# Patient Record
Sex: Female | Born: 1970 | Race: White | Hispanic: No | Marital: Married | Smoking: Never smoker
Health system: Southern US, Community
[De-identification: ages and names within clinical notes are randomized; demographics above are authoritative.]

## PROBLEM LIST (undated history)

## (undated) DIAGNOSIS — Z87442 Personal history of urinary calculi: Secondary | ICD-10-CM

## (undated) DIAGNOSIS — F329 Major depressive disorder, single episode, unspecified: Secondary | ICD-10-CM

## (undated) DIAGNOSIS — R011 Cardiac murmur, unspecified: Secondary | ICD-10-CM

## (undated) DIAGNOSIS — R51 Headache: Secondary | ICD-10-CM

## (undated) DIAGNOSIS — R519 Headache, unspecified: Secondary | ICD-10-CM

## (undated) DIAGNOSIS — I10 Essential (primary) hypertension: Secondary | ICD-10-CM

## (undated) DIAGNOSIS — K259 Gastric ulcer, unspecified as acute or chronic, without hemorrhage or perforation: Secondary | ICD-10-CM

## (undated) DIAGNOSIS — N189 Chronic kidney disease, unspecified: Secondary | ICD-10-CM

## (undated) DIAGNOSIS — F32A Depression, unspecified: Secondary | ICD-10-CM

## (undated) DIAGNOSIS — K219 Gastro-esophageal reflux disease without esophagitis: Secondary | ICD-10-CM

## (undated) DIAGNOSIS — M1990 Unspecified osteoarthritis, unspecified site: Secondary | ICD-10-CM

## (undated) HISTORY — PX: MOUTH SURGERY: SHX715

---
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
## 2003-12-21 ENCOUNTER — Ambulatory Visit: Payer: Self-pay | Admitting: Obstetrics and Gynecology

## 2004-02-16 ENCOUNTER — Ambulatory Visit: Payer: Self-pay | Admitting: Obstetrics and Gynecology

## 2004-03-17 HISTORY — PX: DILATION AND CURETTAGE OF UTERUS: SHX78

## 2004-07-25 ENCOUNTER — Ambulatory Visit: Payer: Self-pay | Admitting: Obstetrics and Gynecology

## 2005-02-12 ENCOUNTER — Other Ambulatory Visit: Payer: Self-pay

## 2005-02-12 ENCOUNTER — Emergency Department: Payer: Self-pay | Admitting: Emergency Medicine

## 2005-03-05 ENCOUNTER — Ambulatory Visit: Payer: Self-pay | Admitting: Unknown Physician Specialty

## 2006-02-20 ENCOUNTER — Observation Stay: Payer: Self-pay | Admitting: Obstetrics and Gynecology

## 2006-02-25 ENCOUNTER — Inpatient Hospital Stay: Payer: Self-pay | Admitting: Obstetrics and Gynecology

## 2006-10-15 ENCOUNTER — Ambulatory Visit: Payer: Self-pay | Admitting: Obstetrics and Gynecology

## 2006-10-21 ENCOUNTER — Encounter: Admission: RE | Admit: 2006-10-21 | Discharge: 2006-10-21 | Payer: Self-pay | Admitting: Obstetrics and Gynecology

## 2010-08-20 ENCOUNTER — Emergency Department: Payer: Self-pay | Admitting: Internal Medicine

## 2011-03-17 ENCOUNTER — Ambulatory Visit: Payer: Self-pay | Admitting: Obstetrics and Gynecology

## 2011-03-19 ENCOUNTER — Ambulatory Visit: Payer: Self-pay | Admitting: Obstetrics and Gynecology

## 2011-11-14 ENCOUNTER — Other Ambulatory Visit: Payer: Self-pay | Admitting: Obstetrics and Gynecology

## 2011-11-14 LAB — PLATELET COUNT: Platelet: 274 10*3/uL (ref 150–440)

## 2011-12-09 ENCOUNTER — Other Ambulatory Visit: Payer: Self-pay | Admitting: Obstetrics and Gynecology

## 2013-03-03 ENCOUNTER — Ambulatory Visit: Payer: Self-pay | Admitting: Obstetrics and Gynecology

## 2014-05-01 ENCOUNTER — Emergency Department: Payer: Self-pay | Admitting: Student

## 2015-01-26 ENCOUNTER — Emergency Department: Payer: Worker's Compensation

## 2015-01-26 ENCOUNTER — Emergency Department
Admission: EM | Admit: 2015-01-26 | Discharge: 2015-01-26 | Disposition: A | Discharge status: Home / Self Care | Payer: Worker's Compensation | Attending: Emergency Medicine | Admitting: Emergency Medicine

## 2015-01-26 DIAGNOSIS — Y998 Other external cause status: Secondary | ICD-10-CM | POA: Insufficient documentation

## 2015-01-26 DIAGNOSIS — W268XXA Contact with other sharp object(s), not elsewhere classified, initial encounter: Secondary | ICD-10-CM | POA: Diagnosis not present

## 2015-01-26 DIAGNOSIS — Y9289 Other specified places as the place of occurrence of the external cause: Secondary | ICD-10-CM | POA: Diagnosis not present

## 2015-01-26 DIAGNOSIS — Y9389 Activity, other specified: Secondary | ICD-10-CM | POA: Diagnosis not present

## 2015-01-26 DIAGNOSIS — S61311A Laceration without foreign body of left index finger with damage to nail, initial encounter: Secondary | ICD-10-CM | POA: Insufficient documentation

## 2015-01-26 DIAGNOSIS — Z23 Encounter for immunization: Secondary | ICD-10-CM | POA: Insufficient documentation

## 2015-01-26 DIAGNOSIS — S61319A Laceration without foreign body of unspecified finger with damage to nail, initial encounter: Secondary | ICD-10-CM

## 2015-01-26 MED ORDER — LIDOCAINE HCL (PF) 1 % IJ SOLN
10.0000 mL | Freq: Once | INTRAMUSCULAR | Status: AC
  Administered 2015-01-26: 10 mL
  Filled 2015-01-26: qty 10

## 2015-01-26 MED ORDER — HYDROCODONE-ACETAMINOPHEN 5-325 MG PO TABS: 1.0000 | ORAL_TABLET | ORAL | Status: DC | PRN

## 2015-01-26 MED ORDER — HYDROCODONE-ACETAMINOPHEN 5-325 MG PO TABS
1.0000 | ORAL_TABLET | Freq: Once | ORAL | Status: AC
  Administered 2015-01-26: 1 via ORAL

## 2015-01-26 MED ORDER — HYDROCODONE-ACETAMINOPHEN 5-325 MG PO TABS
ORAL_TABLET | ORAL | Status: AC
  Filled 2015-01-26: qty 1

## 2015-01-26 MED ORDER — TETANUS-DIPHTH-ACELL PERTUSSIS 5-2.5-18.5 LF-MCG/0.5 IM SUSP
0.5000 mL | Freq: Once | INTRAMUSCULAR | Status: AC
  Administered 2015-01-26: 0.5 mL via INTRAMUSCULAR
  Filled 2015-01-26: qty 0.5

## 2015-01-26 NOTE — ED Provider Notes (Signed)
[facility] Emergency Department Provider Note  ____________________________________________  Time seen: Approximately 4:09 PM  I have reviewed the triage vital signs and the nursing notes.   HISTORY  Chief Complaint Laceration    HPI Paula Estrada is a 44 y.o. female who presents emergency department complaining of a laceration to her left index finger. She states that she was using a levered paper cutter when she accidentally cut the distal portion of her index finger. She wrapped the wound in a bandage prior to arrival. Bleeding was controlled prior to arrival. Patient's last tetanus was over 5 years ago. The patient does endorse some pain to area. She does endorse minor numbness to the distal aspect of the finger. She does endorse that the laceration went through the nail. She endorses full range of motion to affected digit. Eyes any other injury or complaint.   No past medical history on file.  There are no active problems to display for this patient.   No past surgical history on file.  Current Outpatient Rx  Name  Route  Sig  Dispense  Refill  . HYDROcodone-acetaminophen (NORCO/VICODIN) 5-325 MG tablet   Oral   Take 1 tablet by mouth every 4 (four) hours as needed for moderate pain.   20 tablet   0     Allergies Review of patient's allergies indicates no known allergies.  No family history on file.  Social History Social History  Substance Use Topics  . Smoking status: Not on file  . Smokeless tobacco: Not on file  . Alcohol Use: Not on file    Review of Systems Constitutional: No fever/chills Eyes: No visual changes. ENT: No sore throat. Cardiovascular: Denies chest pain. Respiratory: Denies shortness of breath. Gastrointestinal: No abdominal pain.  No nausea, no vomiting.  No diarrhea.  No constipation. Genitourinary: Negative for dysuria. Musculoskeletal: Negative for back pain. Skin: Negative for rash. Endorses a laceration  to the left index finger traversing through the nailbed. Neurological: Negative for headaches, focal weakness or numbness.  10-point ROS otherwise negative.  ____________________________________________   PHYSICAL EXAM:  VITAL SIGNS: ED Triage Vitals  Enc Vitals Group     BP 01/26/15 1514 165/94 mmHg     Pulse Rate 01/26/15 1514 82     Resp 01/26/15 1514 18     Temp 01/26/15 1514 98.1 F (36.7 C)     Temp Source 01/26/15 1514 Oral     SpO2 01/26/15 1514 98 %     Weight 01/26/15 1514 185 lb (83.915 kg)     Height 01/26/15 1514 5\' 3"  (1.6 m)     Head Cir --      Peak Flow --      Pain Score 01/26/15 1515 3     Pain Loc --      Pain Edu? --      Excl. in [place]? --     Constitutional: Alert and oriented. Well appearing and in no acute distress. Eyes: Conjunctivae are normal. PERRL. EOMI. Head: Atraumatic. Nose: No congestion/rhinnorhea. Mouth/Throat: Mucous membranes are moist.  Oropharynx non-erythematous. Neck: No stridor.   Cardiovascular: Normal rate, regular rhythm. Grossly normal heart sounds.  Good peripheral circulation. Respiratory: Normal respiratory effort.  No retractions. Lungs CTAB. Gastrointestinal: Soft and nontender. No distention. No abdominal bruits. No CVA tenderness. Musculoskeletal: No lower extremity tenderness nor edema.  No joint effusions. Neurologic:  Normal speech and language. No gross focal neurologic deficits are appreciated. No gait instability. Skin:  Skin is warm,  dry. No rash noted. Laceration to the distal aspect of second digit left hand. Laceration traverses from the medial edge of the fingernail through the fingernail including the distal portion of finger. Bleeding is controlled. Sensation is intact. Cap refill is intact. No visible foreign body. Psychiatric: Mood and affect are normal. Speech and behavior are normal.  ____________________________________________   LABS (all labs ordered are listed, but only abnormal results are  displayed)  Labs Reviewed - No data to display ____________________________________________  EKG   ____________________________________________  RADIOLOGY  Left index finger x-ray Impression: No evidence of fracture or dislocation.   I personally reviewed the imaging. ____________________________________________   PROCEDURES  Procedure(s) performed: yes, laceration repair, see procedure note(s).   LACERATION REPAIR Performed by: Darletta Moll Authorized by: Charline Bills Cuthriell Consent: Verbal consent obtained. Risks and benefits: risks, benefits and alternatives were discussed Consent given by: patient Patient identity confirmed: provided demographic data Prepped and Draped in normal sterile fashion Wound explored  Laceration Location: Distal index finger L hand  Laceration Length: 2.5 cm  No Foreign Bodies seen or palpated  Anesthesia: digital block  Local anesthetic: lidocaine 1% without epinephrine  Anesthetic total: 7 ml  Irrigation method: syringe Amount of cleaning: standard  Skin closure: 4-0 Ethilon sutures, skin adhesive   Number of sutures: 2   Technique: Transected nail was removed from nailbed. This was the medial distal aspect. Remaining nailbed is securely intact. Distal aspect of laceration was closed with 2 simple interrupted sutures. Area adjacent to nailbed was closed using skin adhesive. Patient tolerated procedure well with no complications.   Patient tolerance: Patient tolerated the procedure well with no immediate complications.   Critical Care performed: No  ____________________________________________   INITIAL IMPRESSION / ASSESSMENT AND PLAN / ED COURSE  Pertinent labs & imaging results that were available during my care of the patient were reviewed by me and considered in my medical decision making (see chart for details).  Patient's history, symptoms, physical exam are consistent with laceration including nail bed.  Area was thoroughly examined and revealed no foreign body. Patient was in need of a tetanus shot which was applied here in the emergency department. Digital block was used for local anesthesia with good effect. Distal aspect of nail was removed from the nailbed. Laceration was closed using 2 simple interrupted sutures at the distal end of the laceration and skin adhesive for the laceration lying adjacent to remaining nail. Patient was advised to keep area clean and dry and wound was dressed here in the emergency department. She is to follow-up appointment to 10 days for suture removal. Patient verbalizes understanding of diagnosis and treatment plan and verbalizes compliance with same ____________________________________________   FINAL CLINICAL IMPRESSION(S) / ED DIAGNOSES  Final diagnoses:  Laceration of nail bed of finger, initial encounter      Darletta Moll, PA-C 01/27/15 0045  Daymon Larsen, MD 01/28/15 1318

## 2015-01-26 NOTE — ED Notes (Signed)
Pt cut left hand pointer finger with paper cutter. Bleeding controlled.

## 2015-01-26 NOTE — Discharge Instructions (Signed)
Laceration Care, Adult °A laceration is a cut that goes through all of the layers of the skin and into the tissue that is right under the skin. Some lacerations heal on their own. Others need to be closed with stitches (sutures), staples, skin adhesive strips, or skin glue. Proper laceration care minimizes the risk of infection and helps the laceration to heal better. °HOW TO CARE FOR YOUR LACERATION °If sutures or staples were used: °· Keep the wound clean and dry. °· If you were given a bandage (dressing), you should change it at least one time per day or as told by your health care provider. You should also change it if it becomes wet or dirty. °· Keep the wound completely dry for the first 24 hours or as told by your health care provider. After that time, you may shower or bathe. However, make sure that the wound is not soaked in water until after the sutures or staples have been removed. °· Clean the wound one time each day or as told by your health care provider: °· Wash the wound with soap and water. °· Rinse the wound with water to remove all soap. °· Pat the wound dry with a clean towel. Do not rub the wound. °· After cleaning the wound, apply a thin layer of antibiotic ointment as told by your health care provider. This will help to prevent infection and keep the dressing from sticking to the wound. °· Have the sutures or staples removed as told by your health care provider. °If skin adhesive strips were used: °· Keep the wound clean and dry. °· If you were given a bandage (dressing), you should change it at least one time per day or as told by your health care provider. You should also change it if it becomes dirty or wet. °· Do not get the skin adhesive strips wet. You may shower or bathe, but be careful to keep the wound dry. °· If the wound gets wet, pat it dry with a clean towel. Do not rub the wound. °· Skin adhesive strips fall off on their own. You may trim the strips as the wound heals. Do not  remove skin adhesive strips that are still stuck to the wound. They will fall off in time. °If skin glue was used: °· Try to keep the wound dry, but you may briefly wet it in the shower or bath. Do not soak the wound in water, such as by swimming. °· After you have showered or bathed, gently pat the wound dry with a clean towel. Do not rub the wound. °· Do not do any activities that will make you sweat heavily until the skin glue has fallen off on its own. °· Do not apply liquid, cream, or ointment medicine to the wound while the skin glue is in place. Using those may loosen the film before the wound has healed. °· If you were given a bandage (dressing), you should change it at least one time per day or as told by your health care provider. You should also change it if it becomes dirty or wet. °· If a dressing is placed over the wound, be careful not to apply tape directly over the skin glue. Doing that may cause the glue to be pulled off before the wound has healed. °· Do not pick at the glue. The skin glue usually remains in place for 5-10 days, then it falls off of the skin. °General Instructions °· Take over-the-counter and prescription   medicines only as told by your health care provider.  If you were prescribed an antibiotic medicine or ointment, take or apply it as told by your doctor. Do not stop using it even if your condition improves.  To help prevent scarring, make sure to cover your wound with sunscreen whenever you are outside after stitches are removed, after adhesive strips are removed, or when glue remains in place and the wound is healed. Make sure to wear a sunscreen of at least 30 SPF.  Do not scratch or pick at the wound.  Keep all follow-up visits as told by your health care provider. This is important.  Check your wound every day for signs of infection. Watch for:  Redness, swelling, or pain.  Fluid, blood, or pus.  Raise (elevate) the injured area above the level of your heart  while you are sitting or lying down, if possible. SEEK MEDICAL CARE IF:  You received a tetanus shot and you have swelling, severe pain, redness, or bleeding at the injection site.  You have a fever.  A wound that was closed breaks open.  You notice a bad smell coming from your wound or your dressing.  You notice something coming out of the wound, such as wood or glass.  Your pain is not controlled with medicine.  You have increased redness, swelling, or pain at the site of your wound.  You have fluid, blood, or pus coming from your wound.  You notice a change in the color of your skin near your wound.  You need to change the dressing frequently due to fluid, blood, or pus draining from the wound.  You develop a new rash.  You develop numbness around the wound. SEEK IMMEDIATE MEDICAL CARE IF:  You develop severe swelling around the wound.  Your pain suddenly increases and is severe.  You develop painful lumps near the wound or on skin that is anywhere on your body.  You have a red streak going away from your wound.  The wound is on your hand or foot and you cannot properly move a finger or toe.  The wound is on your hand or foot and you notice that your fingers or toes look pale or bluish.   This information is not intended to replace advice given to you by your health care provider. Make sure you discuss any questions you have with your health care provider.   Document Released: 03/03/2005 Document Revised: 07/18/2014 Document Reviewed: 02/27/2014 Elsevier Interactive Patient Education 2016 [place].  Nail Bed Injury The nail bed is the soft tissue under a fingernail or toenail that is the origin for new nail growth. Various types of injuries can occur at the nail bed. These injuries may involve bruising or bleeding under the nail, cuts (lacerations) in the nail or nail bed, or loss of a part of the nail or the whole nail (avulsion). In some cases, a nail bed  injury accompanies another injury, such as a break (fracture) of the bone at the tip of the finger or toe. Nail bed injuries are common in people who have jobs that require performing manual tasks with their hands, such as carpenters and landscapers.  The nail bed includes the growth center of the nail. If this growth center is damaged, the injured nail may not grow back normally if at all. The regrown nail might have an abnormal shape or appearance. It can take several months for a damaged or torn-off nail to regrow. Depending on  the nature and extent of the nail bed injury, there may be a permanent disruption of normal nail growth. CAUSES  Damage to the nail bed area is usually caused by crushing, pinching, cutting, or tearing injuries of the fingertip or toe. For example, these injuries may occur when a fingertip gets caught in a door, hit by a hammer, or damaged in accidents involving electrical tools or power machinery.  SYMPTOMS  Symptoms vary depending on the nature of the injury. Symptoms may include:  Pain in the injured area.  Bleeding.  Swelling.  Discoloration.  Collection of blood under the nail (hematoma).  Deformed or split nail.  Loose nail (not stuck to the nail bed).  Loss of all or part of the nail. DIAGNOSIS  Your caregiver will take a medical history and examine the injured area. You will be asked to describe how the injury occurred. X-rays may be done to see if you have a fracture. Your caregiver might also check for conditions that may affect healing, such as diabetes, nerve problems, or poor circulation.  TREATMENT  Treatment depends on the type of injury.  The injury may not require any special treatment other than keeping the area clean and free of infection.   Your caregiver may drain the collection of blood from under the nail. This can be done by making a small hole in the nail.   Your caregiver may remove all or part of your nail. This might be necessary  to stitch (suture) any laceration in the nail bed. Before doing this, the caregiver will likely give you medication to numb the nail area (local anesthetic). In some cases, the caregiver may choose to numb the entire finger or toe (digital nerve block). Depending on the location and size of the nail bed injury, an avulsed nail is sometimes stitched back in place to provide temporary protection to the nail bed until the new nail grows in.  Your caregiver may apply bandages (dressings) or splints to the area.  You might be prescribed antibiotic medication to help prevent infection.  For certain injuries, your caregiver may direct you to see a hand or foot specialist.  You may need a tetanus shot if:  You cannot remember when you had your last tetanus shot.  You have never had a tetanus shot.  The injury broke your skin. If you get a tetanus shot, your arm may swell, get red, and feel warm to the touch. This is common and not a problem. If you need a tetanus shot and you choose not to have one, there is a rare chance of getting tetanus. Sickness from tetanus can be serious. HOME CARE INSTRUCTIONS   Keep your hand or foot raised (elevated) to relieve pain and swelling.   For an injured toenail, lie in bed or on a couch with your leg on pillows. You can also sit in a recliner with your leg up. Avoid walking or letting your leg dangle. When you walk, wear an open-toe shoe.  For an injured fingernail, keep your hand above the level of your heart. Use pillows on a table or on the arm of your chair while sitting. Use them on your bed while sleeping.   Keep your injury protected with dressings or splints as directed by your caregiver.   Keep any dressings clean and dry. Change or remove your dressings as directed by your caregiver.   Only take over-the-counter or prescription medications as directed by your caregiver. If you were prescribed  antibiotics, take them as directed. Finish them even  if you start to feel better.   Follow up with your caregiver as directed.  SEEK MEDICAL CARE IF:   You have pain that is not controlled with medication.   You have any problems caring for your injury.  SEEK IMMEDIATE MEDICAL CARE IF:   You have increased pain, drainage, or bleeding in the injured area.   You have redness, soreness, and swelling (inflammation) in the injured area.  You have a fever or persistent symptoms for more than 2-3 days.  You have a fever and your symptoms suddenly get worse.  You have swelling that spreads from your finger into your hand or from your toe into your foot.  MAKE SURE YOU:  Understand these instructions.  Will watch your condition.  Will get help right away if you are not doing well or get worse.   This information is not intended to replace advice given to you by your health care provider. Make sure you discuss any questions you have with your health care provider.   Document Released: 04/10/2004 Document Revised: 06/28/2012 Document Reviewed: 03/25/2012 Elsevier Interactive Patient Education 2016 [place].  Nail Bed Injury The nail bed is the soft tissue under a fingernail or toenail that is the origin for new nail growth. Various types of injuries can occur at the nail bed. These injuries may involve bruising or bleeding under the nail, cuts (lacerations) in the nail or nail bed, or loss of a part of the nail or the whole nail (avulsion). In some cases, a nail bed injury accompanies another injury, such as a break (fracture) of the bone at the tip of the finger or toe. Nail bed injuries are common in people who have jobs that require performing manual tasks with their hands, such as carpenters and landscapers.  The nail bed includes the growth center of the nail. If this growth center is damaged, the injured nail may not grow back normally if at all. The regrown nail might have an abnormal shape or appearance. It can take several  months for a damaged or torn-off nail to regrow. Depending on the nature and extent of the nail bed injury, there may be a permanent disruption of normal nail growth. CAUSES  Damage to the nail bed area is usually caused by crushing, pinching, cutting, or tearing injuries of the fingertip or toe. For example, these injuries may occur when a fingertip gets caught in a door, hit by a hammer, or damaged in accidents involving electrical tools or power machinery.  SYMPTOMS  Symptoms vary depending on the nature of the injury. Symptoms may include:  Pain in the injured area.  Bleeding.  Swelling.  Discoloration.  Collection of blood under the nail (hematoma).  Deformed or split nail.  Loose nail (not stuck to the nail bed).  Loss of all or part of the nail. DIAGNOSIS  Your caregiver will take a medical history and examine the injured area. You will be asked to describe how the injury occurred. X-rays may be done to see if you have a fracture. Your caregiver might also check for conditions that may affect healing, such as diabetes, nerve problems, or poor circulation.  TREATMENT  Treatment depends on the type of injury.  The injury may not require any special treatment other than keeping the area clean and free of infection.   Your caregiver may drain the collection of blood from under the nail. This can be done by  making a small hole in the nail.   Your caregiver may remove all or part of your nail. This might be necessary to stitch (suture) any laceration in the nail bed. Before doing this, the caregiver will likely give you medication to numb the nail area (local anesthetic). In some cases, the caregiver may choose to numb the entire finger or toe (digital nerve block). Depending on the location and size of the nail bed injury, an avulsed nail is sometimes stitched back in place to provide temporary protection to the nail bed until the new nail grows in.  Your caregiver may apply  bandages (dressings) or splints to the area.  You might be prescribed antibiotic medication to help prevent infection.  For certain injuries, your caregiver may direct you to see a hand or foot specialist.  You may need a tetanus shot if:  You cannot remember when you had your last tetanus shot.  You have never had a tetanus shot.  The injury broke your skin. If you get a tetanus shot, your arm may swell, get red, and feel warm to the touch. This is common and not a problem. If you need a tetanus shot and you choose not to have one, there is a rare chance of getting tetanus. Sickness from tetanus can be serious. HOME CARE INSTRUCTIONS   Keep your hand or foot raised (elevated) to relieve pain and swelling.   For an injured toenail, lie in bed or on a couch with your leg on pillows. You can also sit in a recliner with your leg up. Avoid walking or letting your leg dangle. When you walk, wear an open-toe shoe.  For an injured fingernail, keep your hand above the level of your heart. Use pillows on a table or on the arm of your chair while sitting. Use them on your bed while sleeping.   Keep your injury protected with dressings or splints as directed by your caregiver.   Keep any dressings clean and dry. Change or remove your dressings as directed by your caregiver.   Only take over-the-counter or prescription medications as directed by your caregiver. If you were prescribed antibiotics, take them as directed. Finish them even if you start to feel better.   Follow up with your caregiver as directed.  SEEK MEDICAL CARE IF:   You have pain that is not controlled with medication.   You have any problems caring for your injury.  SEEK IMMEDIATE MEDICAL CARE IF:   You have increased pain, drainage, or bleeding in the injured area.   You have redness, soreness, and swelling (inflammation) in the injured area.  You have a fever or persistent symptoms for more than 2-3  days.  You have a fever and your symptoms suddenly get worse.  You have swelling that spreads from your finger into your hand or from your toe into your foot.  MAKE SURE YOU:  Understand these instructions.  Will watch your condition.  Will get help right away if you are not doing well or get worse.   This information is not intended to replace advice given to you by your health care provider. Make sure you discuss any questions you have with your health care provider.   Document Released: 04/10/2004 Document Revised: 06/28/2012 Document Reviewed: 03/25/2012 Elsevier Interactive Patient Education [employer].

## 2015-02-06 ENCOUNTER — Encounter: Payer: Self-pay | Admitting: Emergency Medicine

## 2015-02-06 ENCOUNTER — Emergency Department
Admission: EM | Admit: 2015-02-06 | Discharge: 2015-02-06 | Disposition: A | Discharge status: Home / Self Care | Payer: Worker's Compensation | Attending: Emergency Medicine | Admitting: Emergency Medicine

## 2015-02-06 DIAGNOSIS — Z4802 Encounter for removal of sutures: Secondary | ICD-10-CM | POA: Diagnosis present

## 2015-02-06 NOTE — Discharge Instructions (Signed)

## 2015-02-06 NOTE — ED Notes (Signed)
Pt to ED for suture removal from left index finger

## 2015-02-06 NOTE — ED Provider Notes (Signed)
[facility] Emergency Department Provider Note   ____________________________________________  Time seen: 1330  I have reviewed the triage vital signs and the nursing notes.   HISTORY  Chief Complaint Suture / Staple Removal  HPI [name] is a 44 y.o. female who presents to the emergency Department for suture removal.Sutures were inserted here on 01/28/2015. She has had occasional bleeding and the finger remains tender but denies concern for infection.    History reviewed. No pertinent past medical history.  There are no active problems to display for this patient.   History reviewed. No pertinent past surgical history.  Current Outpatient Rx  Name  Route  Sig  Dispense  Refill  . HYDROcodone-acetaminophen (NORCO/VICODIN) 5-325 MG tablet   Oral   Take 1 tablet by mouth every 4 (four) hours as needed for moderate pain.   20 tablet   0     Allergies Review of patient's allergies indicates no known allergies.  No family history on file.  Social History Social History  Substance Use Topics  . Smoking status: Never Smoker   . Smokeless tobacco: None  . Alcohol Use: No    Review of Systems  Constitutional: Denies fever.  HEENT: No change from baseline Respiratory: No cough or shortness of breath Musculoskeletal: No pain. Skin: healing wound; pain gradually resolving.  ____________________________________________   PHYSICAL EXAM:  VITAL SIGNS: ED Triage Vitals  Enc Vitals Group     BP 02/06/15 1242 132/94 mmHg     Pulse Rate 02/06/15 1241 74     Resp 02/06/15 1241 18     Temp 02/06/15 1241 98.4 F (36.9 C)     Temp Source 02/06/15 1241 Oral     SpO2 02/06/15 1241 98 %     Weight 02/06/15 1241 170 lb (77.111 kg)     Height 02/06/15 1241 5\' 3"  (1.6 m)     Head Cir --      Peak Flow --      Pain Score 02/06/15 1242 1     Pain Loc --      Pain Edu? --      Excl. in [place]? --       Constitutional: Appears well. No  distress HEENT: Atraumtaic, normal appearance, EOMI, sclera normal, voice normal. Respiratory: Respirations even and unlabored.  Cardiovascular: Capillary refill normal. Peripheral pulses 2+ Musculoskeletal: Full ROM x 4. Skin: Healing laceration to the left index finger. No evidence of infection or cellulitis. Neurovascular: Gait steady; Alert and oriented x 4.   PROCEDURES  Procedure(s) performed: SUTURE REMOVAL Performed by:   Consent: Verbal consent obtained. Patient identity confirmed: provided demographic data Time out: Immediately prior to procedure a "time out" was called to verify the correct patient, procedure, equipment, support staff and site/side marked as required.  Location details: Left index finger  Wound Appearance: clean  Sutures/Staples Removed: #2 by RN   Facility: sutures placed in this facility Patient tolerance: Patient tolerated the procedure well with no immediate complications.    ____________________________________________   INITIAL IMPRESSION / ASSESSMENT AND PLAN / ED COURSE  Pertinent labs & imaging results that were available during my care of the patient were reviewed by me and considered in my medical decision making (see chart for details).  Wound care discussed. Patient advised to keep covered with sunscreen. Patient was advised to return to the ER for symptoms that change or worsen if unable to schedule an appointment with primary care.  ____________________________________________   FINAL CLINICAL  IMPRESSION(S) / ED DIAGNOSES  Final diagnoses:  Visit for suture removal       Victorino Dike, FNP 02/06/15 1455  Lavonia Drafts, MD 02/06/15 1525

## 2015-06-05 ENCOUNTER — Ambulatory Visit
Admission: RE | Admit: 2015-06-05 | Discharge: 2015-06-05 | Disposition: A | Discharge status: Home / Self Care | Payer: BLUE CROSS/BLUE SHIELD | Source: Ambulatory Visit | Attending: Obstetrics and Gynecology | Admitting: Obstetrics and Gynecology

## 2015-06-05 ENCOUNTER — Other Ambulatory Visit: Payer: Self-pay | Admitting: Obstetrics and Gynecology

## 2015-06-05 DIAGNOSIS — Z1231 Encounter for screening mammogram for malignant neoplasm of breast: Secondary | ICD-10-CM

## 2016-06-05 ENCOUNTER — Other Ambulatory Visit: Payer: Self-pay | Admitting: Obstetrics and Gynecology

## 2016-06-05 DIAGNOSIS — Z1231 Encounter for screening mammogram for malignant neoplasm of breast: Secondary | ICD-10-CM

## 2016-06-09 ENCOUNTER — Ambulatory Visit
Admission: RE | Admit: 2016-06-09 | Discharge: 2016-06-09 | Disposition: A | Discharge status: Home / Self Care | Payer: BLUE CROSS/BLUE SHIELD | Source: Ambulatory Visit | Attending: Obstetrics and Gynecology | Admitting: Obstetrics and Gynecology

## 2016-06-09 DIAGNOSIS — Z1231 Encounter for screening mammogram for malignant neoplasm of breast: Secondary | ICD-10-CM | POA: Diagnosis present

## 2016-06-09 DIAGNOSIS — N6489 Other specified disorders of breast: Secondary | ICD-10-CM | POA: Diagnosis not present

## 2016-10-23 NOTE — H&P (Signed)
Paula Sauger, MD  Obstetrics and Gynecology    [] Hide copied text  Chief Complaint:  scheduled for LSH+ bilateral salpingectomy  Or LAVH / BSO if endometriosis is found  Paula Estrada is a 46 y.o. female here for [place] .pt is here for follow up for menorrhagia with irregular cycles .pt seen by me 05/2016 with irregular cycles . FSH and inhibin normal for premenopause . Bleeding q 2 weeks now  Pt with migraine h/a 20 days per month . No neurology consult to date  Not sleeping well . Does not feel depressed  pt with a long h/o pelvic pain starting one week before onset of cycle . Marland Kitchen No dyspareunia ( currently not sexually active )  No FHX of endometriosis  EMBX negative 08/19/2016 Pap neg U/S today shows a fibroid 3 cm . Complex left ovarian cyst 2.1 cm  SIS : fails to show any pathology at the endometrial cavity    Past Medical History:  has a past medical history of Chickenpox; Depression, unspecified; Kidney stones; Migraines; and Stomach ulcer.  Past Surgical History:  has a past surgical history that includes Upper GI (02/2005) and Dilation and curettage of uterus (02/2004). Family History: family history includes Diabetes in her paternal grandfather; Heart disease in her maternal grandfather, maternal grandmother, paternal grandfather, and paternal grandmother; High blood pressure (Hypertension) in her father and maternal aunt; Ovarian cancer in her maternal aunt. Social History:  reports that she has quit smoking. She has never used smokeless tobacco. She reports that she drinks alcohol. She reports that she does not use drugs. OB/GYN History:          OB History    Gravida Para Term Preterm AB Living   4 3 3     3    SAB TAB Ectopic Molar Multiple Live Births                    Allergies: has No Known Allergies. Medications:  Current Outpatient Prescriptions:  .  ascorbic acid, vitamin C, (VITAMIN C) 100 MG tablet, Take 100 mg by mouth once  daily., Disp: , Rfl:  .  butalbital-acetaminophen-caffeine (FIORICET) 50-325-40 mg tablet, Take 1 tablet by mouth every 4 (four) hours as needed for Pain., Disp: 20 tablet, Rfl: 6 .  citalopram (CELEXA) 20 MG tablet, take 1 tablet by mouth once daily, Disp: 30 tablet, Rfl: 11 .  fluticasone (FLONASE) 50 mcg/actuation nasal spray, Place 2 sprays into both nostrils once daily., Disp: , Rfl:  .  Lactobac no.41/Bifidobact no.7 (PROBIOTIC-10 ORAL), Take by mouth., Disp: , Rfl:  .  loratadine (CLARITIN) 10 mg tablet, Take 10 mg by mouth once daily., Disp: , Rfl:  .  multivitamin tablet, Take 1 tablet by mouth once daily., Disp: , Rfl:  .  nortriptyline (PAMELOR) 10 MG capsule, Take 1 pill at night for one week then increase to 2 pills at night, Disp: 60 capsule, Rfl: 3 .  progesterone (PROMETRIUM) 200 MG capsule, Take 1 capsule (200 mg total) by mouth nightly., Disp: 10 capsule, Rfl: 11  Review of Systems: General:                      No fatigue or weight loss Eyes:                           No vision changes Ears:  No hearing difficulty Respiratory:                No cough or shortness of breath Pulmonary:                  No asthma or shortness of breath Cardiovascular:           No chest pain, palpitations, dyspnea on exertion Gastrointestinal:          No abdominal bloating, chronic diarrhea, constipations, masses, pain or hematochezia Genitourinary:             No hematuria, dysuria, abnormal vaginal discharge, pelvic pain, Menometrorrhagia Lymphatic:                   No swollen lymph nodes Musculoskeletal:         No muscle weakness Neurologic:                  No extremity weakness, syncope, seizure disorder Psychiatric:                  No history of depression, delusions or suicidal/homicidal ideation    Exam:      Vitals:   10/23/16  1439  BP: (!) 130/99  Pulse: 93    Body mass index is 34.37 kg/m.  WDWN white/ female in NAD   Lungs: CTA    CV : RRR without murmur   Breast: exam done in sitting and lying position : No dimpling or retraction, no dominant mass, no spontaneous discharge, no axillary adenopathy Neck:  no thyromegaly Abdomen: soft , no mass, normal active bowel sounds,  non-tender, no rebound tenderness Pelvic: tanner stage 5 ,  External genitalia: vulva /labia no lesions Urethra: no prolapse Vagina: normal physiologic d/c, adequate room for LAVH  Cervix: no lesions, no cervical motion tenderness   Uterus: normal size shape and contour, non-tender Adnexa: no mass,  non-tender   Rectovaginal: Saline infusion sonohysterography: betadine prep to the cervix followed by placement of the HSG catheter into the endometrial canal . Sterile H2O is injected while performing a transvaginal u/s . Findings:no endometrial pathology  Impression:   The primary encounter diagnosis was Menorrhagia with irregular cycle. Diagnoses of Pelvic pain in female and Screening for cervical cancer were also pertinent to this visit.  Possible endometriosis  Plan:  I have spoken with the patient regarding treatment options including expectant management, hormonal options, or surgical intervention. After a full discussion the pt elects to proceed with [facility]  Bilateral salpingectomy . If endometriosis is found than a LAVH with BSO .   pro and cons discussed with the pt of endometrial ablation vs definitive hysterectomy                                    [place]    .  Paula Sauger, MD

## 2016-10-24 ENCOUNTER — Encounter
Admission: RE | Admit: 2016-10-24 | Discharge: 2016-10-24 | Disposition: A | Discharge status: Home / Self Care | Payer: BLUE CROSS/BLUE SHIELD | Source: Ambulatory Visit | Attending: Obstetrics and Gynecology | Admitting: Obstetrics and Gynecology

## 2016-10-24 HISTORY — DX: Gastro-esophageal reflux disease without esophagitis: K21.9

## 2016-10-24 HISTORY — DX: Cardiac murmur, unspecified: R01.1

## 2016-10-24 HISTORY — DX: Headache: R51

## 2016-10-24 HISTORY — DX: Unspecified osteoarthritis, unspecified site: M19.90

## 2016-10-24 HISTORY — DX: Headache, unspecified: R51.9

## 2016-10-24 HISTORY — DX: Personal history of urinary calculi: Z87.442

## 2016-10-24 NOTE — Patient Instructions (Addendum)
  Your procedure is scheduled on: 10-27-16 Report to Same Day Surgery 2nd floor medical mall [facility] Entrance-take elevator on left to 2nd floor.  Check in with surgery information desk.) To find out your arrival time please call [phone] between 1PM - 3PM on 10-24-16  Remember: Instructions that are not followed completely may result in serious medical risk, up to and including death, or upon the discretion of your surgeon and anesthesiologist your surgery may need to be rescheduled.    _x___ 1. Do not eat food or drink liquids after midnight. No gum chewing or hard candies.     __x__ 2. No Alcohol for 24 hours before or after surgery.   __x__3. No Smoking for 24 prior to surgery.   ____  4. Bring all medications with you on the day of surgery if instructed.    __x__ 5. Notify your doctor if there is any change in your medical condition     (cold, fever, infections).     Do not wear jewelry, make-up, hairpins, clips or nail polish.  Do not wear lotions, powders, or perfumes. You may wear deodorant.  Do not shave 48 hours prior to surgery. Men may shave face and neck.  Do not bring valuables to the hospital.    [facility] is not responsible for any belongings or valuables.               Contacts, dentures or bridgework may not be worn into surgery.  Leave your suitcase in the car. After surgery it may be brought to your room.  For patients admitted to the hospital, discharge time is determined by your treatment team.   Patients discharged the day of surgery will not be allowed to drive home.  You will need someone to drive you home and stay with you the night of your procedure.    Please read over the following fact sheets that you were given:     ____ Take anti-hypertensive (unless it includes a diuretic), cardiac, seizure, asthma,     anti-reflux and psychiatric medicines. These include:  1. none  2.  3.  4.  5.  6.  _X___Fleets enema or Magnesium Citrate as  directed-DO FLEETS ENEMA AT HOME 1 HOUR PRIOR TO [place]  ____ Use CHG Soap or sage wipes as directed on instruction sheet   ____ Use inhalers on the day of surgery and bring to hospital day of surgery  ____ Stop Metformin and Janumet 2 days prior to surgery.    ____ Take 1/2 of usual insulin dose the night before surgery and none on the morning surgery.   ____ Follow recommendations from Cardiologist, Pulmonologist or PCP regarding stopping Aspirin, Coumadin, Pllavix ,Eliquis, Effient, or Pradaxa, and Pletal.  X____Stop Anti-inflammatories such as Advil, Aleve, Ibuprofen, Motrin, Naproxen, Naprosyn, Goodies powders or aspirin products. NOW-OK to take Tylenol   _x___ Stop supplements until after surgery-STOP MELATONIN NOW-MAY RESUME AFTER SURGERY   ____ Bring C-Pap to the hospital.

## 2016-10-26 MED ORDER — CEFOXITIN SODIUM-DEXTROSE 2-2.2 GM-% IV SOLR (PREMIX)
2.0000 g | INTRAVENOUS | Status: AC
  Administered 2016-10-27: 2 g via INTRAVENOUS

## 2016-10-27 ENCOUNTER — Ambulatory Visit: Payer: BLUE CROSS/BLUE SHIELD | Admitting: Certified Registered"

## 2016-10-27 ENCOUNTER — Encounter: Payer: Self-pay | Admitting: *Deleted

## 2016-10-27 ENCOUNTER — Encounter
Admission: RE | Disposition: A | Discharge status: Home / Self Care | Payer: Self-pay | Source: Ambulatory Visit | Attending: Obstetrics and Gynecology

## 2016-10-27 ENCOUNTER — Observation Stay
Admission: RE | Admit: 2016-10-27 | Discharge: 2016-10-28 | Disposition: A | Discharge status: Home / Self Care | Payer: BLUE CROSS/BLUE SHIELD | Source: Ambulatory Visit | Attending: Obstetrics and Gynecology | Admitting: Obstetrics and Gynecology

## 2016-10-27 DIAGNOSIS — R102 Pelvic and perineal pain: Secondary | ICD-10-CM | POA: Insufficient documentation

## 2016-10-27 DIAGNOSIS — F329 Major depressive disorder, single episode, unspecified: Secondary | ICD-10-CM | POA: Insufficient documentation

## 2016-10-27 DIAGNOSIS — G8929 Other chronic pain: Secondary | ICD-10-CM | POA: Diagnosis not present

## 2016-10-27 DIAGNOSIS — Z9889 Other specified postprocedural states: Secondary | ICD-10-CM

## 2016-10-27 DIAGNOSIS — Z79899 Other long term (current) drug therapy: Secondary | ICD-10-CM | POA: Insufficient documentation

## 2016-10-27 DIAGNOSIS — N809 Endometriosis, unspecified: Secondary | ICD-10-CM | POA: Insufficient documentation

## 2016-10-27 DIAGNOSIS — N921 Excessive and frequent menstruation with irregular cycle: Secondary | ICD-10-CM | POA: Diagnosis not present

## 2016-10-27 DIAGNOSIS — N8311 Corpus luteum cyst of right ovary: Secondary | ICD-10-CM | POA: Insufficient documentation

## 2016-10-27 DIAGNOSIS — N72 Inflammatory disease of cervix uteri: Secondary | ICD-10-CM | POA: Diagnosis not present

## 2016-10-27 DIAGNOSIS — D251 Intramural leiomyoma of uterus: Secondary | ICD-10-CM | POA: Insufficient documentation

## 2016-10-27 DIAGNOSIS — Z8041 Family history of malignant neoplasm of ovary: Secondary | ICD-10-CM | POA: Insufficient documentation

## 2016-10-27 DIAGNOSIS — Z87891 Personal history of nicotine dependence: Secondary | ICD-10-CM | POA: Insufficient documentation

## 2016-10-27 DIAGNOSIS — Z7951 Long term (current) use of inhaled steroids: Secondary | ICD-10-CM | POA: Insufficient documentation

## 2016-10-27 DIAGNOSIS — N838 Other noninflammatory disorders of ovary, fallopian tube and broad ligament: Secondary | ICD-10-CM | POA: Insufficient documentation

## 2016-10-27 HISTORY — PX: LAPAROSCOPIC SUPRACERVICAL HYSTERECTOMY: SHX5399

## 2016-10-27 HISTORY — PX: LAPAROSCOPIC ASSISTED VAGINAL HYSTERECTOMY: SHX5398

## 2016-10-27 HISTORY — PX: LAPAROSCOPIC BILATERAL SALPINGECTOMY: SHX5889

## 2016-10-27 LAB — POCT PREGNANCY, URINE: PREG TEST UR: NEGATIVE

## 2016-10-27 LAB — BASIC METABOLIC PANEL
Anion gap: 9 (ref 5–15)
BUN: 14 mg/dL (ref 6–20)
CHLORIDE: 105 mmol/L (ref 101–111)
CO2: 25 mmol/L (ref 22–32)
CREATININE: 0.89 mg/dL (ref 0.44–1.00)
Calcium: 8.5 mg/dL — ABNORMAL LOW (ref 8.9–10.3)
GFR calc Af Amer: >60 mL/min (ref >60)
GFR calc non Af Amer: >60 mL/min (ref >60)
GLUCOSE: 112 mg/dL — AB (ref 65–99)
POTASSIUM: 3.4 mmol/L — AB (ref 3.5–5.1)
Sodium: 139 mmol/L (ref 135–145)

## 2016-10-27 LAB — CBC
HEMATOCRIT: 39.4 % (ref 35.0–47.0)
HEMOGLOBIN: 13.5 g/dL (ref 12.0–16.0)
MCH: 29.1 pg (ref 26.0–34.0)
MCHC: 34.3 g/dL (ref 32.0–36.0)
MCV: 84.8 fL (ref 80.0–100.0)
Platelets: 290 10*3/uL (ref 150–440)
RBC: 4.65 MIL/uL (ref 3.80–5.20)
RDW: 12.6 % (ref 11.5–14.5)
WBC: 8.1 10*3/uL (ref 3.6–11.0)

## 2016-10-27 LAB — TYPE AND SCREEN
ABO/RH(D): O POS
Antibody Screen: NEGATIVE

## 2016-10-27 LAB — ABO/RH: ABO/RH(D): O POS

## 2016-10-27 SURGERY — HYSTERECTOMY, SUPRACERVICAL, LAPAROSCOPIC
Anesthesia: General

## 2016-10-27 MED ORDER — PHENYLEPHRINE HCL 10 MG/ML IJ SOLN
INTRAMUSCULAR | Status: AC
  Filled 2016-10-27: qty 1

## 2016-10-27 MED ORDER — LACTATED RINGERS IV SOLN: INTRAVENOUS | Status: DC

## 2016-10-27 MED ORDER — LIDOCAINE-EPINEPHRINE 1 %-1:100000 IJ SOLN
INTRAMUSCULAR | Status: DC | PRN
  Administered 2016-10-27: 9 mL

## 2016-10-27 MED ORDER — ONDANSETRON HCL 4 MG/2ML IJ SOLN: 4.0000 mg | Freq: Once | INTRAMUSCULAR | Status: DC | PRN

## 2016-10-27 MED ORDER — EPHEDRINE SULFATE 50 MG/ML IJ SOLN
INTRAMUSCULAR | Status: AC
  Filled 2016-10-27: qty 1

## 2016-10-27 MED ORDER — LIDOCAINE-EPINEPHRINE 1 %-1:100000 IJ SOLN
INTRAMUSCULAR | Status: AC
  Filled 2016-10-27: qty 1

## 2016-10-27 MED ORDER — ROCURONIUM BROMIDE 100 MG/10ML IV SOLN
INTRAVENOUS | Status: DC | PRN
Start: 2016-10-27 — End: 2016-10-27
  Administered 2016-10-27: 10 mg via INTRAVENOUS
  Administered 2016-10-27: 15 mg via INTRAVENOUS
  Administered 2016-10-27: 40 mg via INTRAVENOUS

## 2016-10-27 MED ORDER — ONDANSETRON HCL 4 MG PO TABS: 4.0000 mg | ORAL_TABLET | Freq: Four times a day (QID) | ORAL | Status: DC | PRN

## 2016-10-27 MED ORDER — SUGAMMADEX SODIUM 200 MG/2ML IV SOLN
INTRAVENOUS | Status: DC | PRN
  Administered 2016-10-27: 200 mg via INTRAVENOUS

## 2016-10-27 MED ORDER — LACTATED RINGERS IV SOLN
INTRAVENOUS | Status: DC
  Administered 2016-10-27: 07:00:00 via INTRAVENOUS

## 2016-10-27 MED ORDER — LACTATED RINGERS IV SOLN
INTRAVENOUS | Status: DC
  Administered 2016-10-27 (×2): via INTRAVENOUS

## 2016-10-27 MED ORDER — CEFOXITIN SODIUM-DEXTROSE 2-2.2 GM-% IV SOLR (PREMIX)
INTRAVENOUS | Status: AC
Start: 2016-10-27 — End: 2016-10-27
  Filled 2016-10-27: qty 50

## 2016-10-27 MED ORDER — ONDANSETRON HCL 4 MG/2ML IJ SOLN
INTRAMUSCULAR | Status: DC | PRN
  Administered 2016-10-27: 4 mg via INTRAVENOUS

## 2016-10-27 MED ORDER — FENTANYL CITRATE (PF) 250 MCG/5ML IJ SOLN
INTRAMUSCULAR | Status: AC
  Filled 2016-10-27: qty 5

## 2016-10-27 MED ORDER — FAMOTIDINE 20 MG PO TABS
ORAL_TABLET | ORAL | Status: AC
Start: 2016-10-27 — End: 2016-10-27
  Filled 2016-10-27: qty 1

## 2016-10-27 MED ORDER — LIDOCAINE HCL (PF) 2 % IJ SOLN
INTRAMUSCULAR | Status: AC
  Filled 2016-10-27: qty 2

## 2016-10-27 MED ORDER — PROPOFOL 10 MG/ML IV BOLUS
INTRAVENOUS | Status: AC
  Filled 2016-10-27: qty 20

## 2016-10-27 MED ORDER — ACETAMINOPHEN 10 MG/ML IV SOLN
INTRAVENOUS | Status: DC | PRN
  Administered 2016-10-27: 1000 mg via INTRAVENOUS

## 2016-10-27 MED ORDER — KETOROLAC TROMETHAMINE 30 MG/ML IJ SOLN
INTRAMUSCULAR | Status: AC
  Filled 2016-10-27: qty 1

## 2016-10-27 MED ORDER — MIDAZOLAM HCL 2 MG/2ML IJ SOLN
INTRAMUSCULAR | Status: DC | PRN
  Administered 2016-10-27: 2 mg via INTRAVENOUS

## 2016-10-27 MED ORDER — BUPIVACAINE HCL 0.5 % IJ SOLN
INTRAMUSCULAR | Status: DC | PRN
  Administered 2016-10-27: 17 mL

## 2016-10-27 MED ORDER — OXYCODONE-ACETAMINOPHEN 5-325 MG PO TABS
1.0000 | ORAL_TABLET | ORAL | Status: DC | PRN
  Administered 2016-10-27 (×3): 2 via ORAL
  Filled 2016-10-27 (×4): qty 2

## 2016-10-27 MED ORDER — LIDOCAINE HCL (CARDIAC) 20 MG/ML IV SOLN
INTRAVENOUS | Status: DC | PRN
  Administered 2016-10-27: 50 mg via INTRAVENOUS

## 2016-10-27 MED ORDER — FENTANYL CITRATE (PF) 100 MCG/2ML IJ SOLN
INTRAMUSCULAR | Status: DC | PRN
  Administered 2016-10-27: 50 ug via INTRAVENOUS
  Administered 2016-10-27: 100 ug via INTRAVENOUS
  Administered 2016-10-27 (×2): 50 ug via INTRAVENOUS

## 2016-10-27 MED ORDER — ACETAMINOPHEN NICU IV SYRINGE 10 MG/ML
INTRAVENOUS | Status: AC
  Filled 2016-10-27: qty 1

## 2016-10-27 MED ORDER — DEXAMETHASONE SODIUM PHOSPHATE 10 MG/ML IJ SOLN
INTRAMUSCULAR | Status: DC | PRN
  Administered 2016-10-27: 10 mg via INTRAVENOUS

## 2016-10-27 MED ORDER — ONDANSETRON HCL 4 MG/2ML IJ SOLN
INTRAMUSCULAR | Status: AC
  Filled 2016-10-27: qty 2

## 2016-10-27 MED ORDER — BUPIVACAINE HCL (PF) 0.5 % IJ SOLN
INTRAMUSCULAR | Status: AC
  Filled 2016-10-27: qty 30

## 2016-10-27 MED ORDER — FAMOTIDINE 20 MG PO TABS
20.0000 mg | ORAL_TABLET | Freq: Once | ORAL | Status: AC
  Administered 2016-10-27: 20 mg via ORAL

## 2016-10-27 MED ORDER — KETOROLAC TROMETHAMINE 30 MG/ML IJ SOLN
INTRAMUSCULAR | Status: DC | PRN
  Administered 2016-10-27: 30 mg via INTRAVENOUS

## 2016-10-27 MED ORDER — CITALOPRAM HYDROBROMIDE 20 MG PO TABS
20.0000 mg | ORAL_TABLET | Freq: Every day | ORAL | Status: DC
  Filled 2016-10-27: qty 1

## 2016-10-27 MED ORDER — ROCURONIUM BROMIDE 50 MG/5ML IV SOLN
INTRAVENOUS | Status: AC
  Filled 2016-10-27: qty 1

## 2016-10-27 MED ORDER — FLEET ENEMA 7-19 GM/118ML RE ENEM: 1.0000 | ENEMA | Freq: Once | RECTAL | Status: DC

## 2016-10-27 MED ORDER — MIDAZOLAM HCL 2 MG/2ML IJ SOLN
INTRAMUSCULAR | Status: AC
  Filled 2016-10-27: qty 2

## 2016-10-27 MED ORDER — SIMETHICONE 80 MG PO CHEW
80.0000 mg | CHEWABLE_TABLET | Freq: Four times a day (QID) | ORAL | Status: DC | PRN
  Administered 2016-10-27 (×2): 80 mg via ORAL
  Filled 2016-10-27 (×2): qty 1

## 2016-10-27 MED ORDER — MORPHINE SULFATE (PF) 4 MG/ML IV SOLN
1.0000 mg | INTRAVENOUS | Status: DC | PRN
  Administered 2016-10-27 (×3): 2 mg via INTRAVENOUS
  Filled 2016-10-27 (×3): qty 1

## 2016-10-27 MED ORDER — ONDANSETRON HCL 4 MG/2ML IJ SOLN
4.0000 mg | Freq: Four times a day (QID) | INTRAMUSCULAR | Status: DC | PRN
Start: 2016-10-27 — End: 2016-10-28

## 2016-10-27 MED ORDER — PROPOFOL 10 MG/ML IV BOLUS
INTRAVENOUS | Status: DC | PRN
  Administered 2016-10-27: 170 mg via INTRAVENOUS

## 2016-10-27 MED ORDER — SUGAMMADEX SODIUM 200 MG/2ML IV SOLN
INTRAVENOUS | Status: AC
  Filled 2016-10-27: qty 2

## 2016-10-27 MED ORDER — FENTANYL CITRATE (PF) 100 MCG/2ML IJ SOLN: 25.0000 ug | INTRAMUSCULAR | Status: DC | PRN

## 2016-10-27 MED ORDER — DEXAMETHASONE SODIUM PHOSPHATE 10 MG/ML IJ SOLN
INTRAMUSCULAR | Status: AC
  Filled 2016-10-27: qty 1

## 2016-10-27 SURGICAL SUPPLY — 59 items
BAG URO DRAIN 2000ML W/SPOUT (MISCELLANEOUS) ×4 IMPLANT
BLADE SURG SZ11 CARB STEEL (BLADE) ×8 IMPLANT
CANISTER SUCT 1200ML W/VALVE (MISCELLANEOUS) ×4 IMPLANT
CATH FOLEY 2WAY  5CC 16FR (CATHETERS) ×2
CATH URTH 16FR FL 2W BLN LF (CATHETERS) ×2 IMPLANT
CHLORAPREP W/TINT 26ML (MISCELLANEOUS) ×4 IMPLANT
CLOSURE WOUND 1/2 X4 (GAUZE/BANDAGES/DRESSINGS) ×1
DERMABOND ADVANCED (GAUZE/BANDAGES/DRESSINGS) ×2
DERMABOND ADVANCED .7 DNX12 (GAUZE/BANDAGES/DRESSINGS) ×2 IMPLANT
DRAPE SURG 17X11 SM STRL (DRAPES) IMPLANT
DRSG TEGADERM 2-3/8X2-3/4 SM (GAUZE/BANDAGES/DRESSINGS) ×12 IMPLANT
DRSG TEGADERM 2X2.25 PEDS (GAUZE/BANDAGES/DRESSINGS) ×4 IMPLANT
ELECT REM PT RETURN 9FT ADLT (ELECTROSURGICAL) ×4
ELECTRODE REM PT RTRN 9FT ADLT (ELECTROSURGICAL) ×2 IMPLANT
FILTER LAP SMOKE EVAC STRL (MISCELLANEOUS) ×4 IMPLANT
GAUZE SPONGE NON-WVN 2X2 STRL (MISCELLANEOUS) ×4 IMPLANT
GLOVE BIO SURGEON STRL SZ8 (GLOVE) ×16 IMPLANT
GOWN STRL REUS W/ TWL LRG LVL3 (GOWN DISPOSABLE) ×6 IMPLANT
GOWN STRL REUS W/ TWL XL LVL3 (GOWN DISPOSABLE) ×6 IMPLANT
GOWN STRL REUS W/TWL LRG LVL3 (GOWN DISPOSABLE) ×6
GOWN STRL REUS W/TWL XL LVL3 (GOWN DISPOSABLE) ×6
GRASPER SUT TROCAR 14GX15 (MISCELLANEOUS) ×4 IMPLANT
HANDLE YANKAUER SUCT BULB TIP (MISCELLANEOUS) ×4 IMPLANT
IRRIGATION STRYKERFLOW (MISCELLANEOUS) ×2 IMPLANT
IRRIGATOR STRYKERFLOW (MISCELLANEOUS) ×4
IV LACTATED RINGERS 1000ML (IV SOLUTION) ×4 IMPLANT
KIT PINK PAD W/HEAD ARE REST (MISCELLANEOUS) ×4
KIT PINK PAD W/HEAD ARM REST (MISCELLANEOUS) ×2 IMPLANT
KIT RM TURNOVER CYSTO AR (KITS) ×4 IMPLANT
LABEL OR SOLS (LABEL) ×4 IMPLANT
MORCELLATOR XCISE  COR (MISCELLANEOUS)
MORCELLATOR XCISE COR (MISCELLANEOUS) IMPLANT
NS IRRIG 500ML POUR BTL (IV SOLUTION) ×4 IMPLANT
PACK BASIN MINOR ARMC (MISCELLANEOUS) ×4 IMPLANT
PACK GYN LAPAROSCOPIC (MISCELLANEOUS) ×4 IMPLANT
PAD OB MATERNITY 4.3X12.25 (PERSONAL CARE ITEMS) ×4 IMPLANT
PAD PREP 24X41 OB/GYN DISP (PERSONAL CARE ITEMS) ×4 IMPLANT
SCISSORS METZENBAUM CVD 33 (INSTRUMENTS) IMPLANT
SET CYSTO W/LG BORE CLAMP LF (SET/KITS/TRAYS/PACK) ×4 IMPLANT
SHEARS HARMONIC ACE PLUS 36CM (ENDOMECHANICALS) ×4 IMPLANT
SLEEVE ENDOPATH XCEL 5M (ENDOMECHANICALS) IMPLANT
SOLUTION ELECTROLUBE (MISCELLANEOUS) ×4 IMPLANT
SPONGE VERSALON 2X2 STRL (MISCELLANEOUS) ×4
SPONGE XRAY 4X4 16PLY STRL (MISCELLANEOUS) ×4 IMPLANT
STRIP CLOSURE SKIN 1/2X4 (GAUZE/BANDAGES/DRESSINGS) ×3 IMPLANT
SUT VIC AB 0 CT1 27 (SUTURE) ×4
SUT VIC AB 0 CT1 27XCR 8 STRN (SUTURE) ×4 IMPLANT
SUT VIC AB 0 CT1 36 (SUTURE) ×8 IMPLANT
SUT VIC AB 0 CT2 27 (SUTURE) IMPLANT
SUT VIC AB 2-0 UR6 27 (SUTURE) IMPLANT
SUT VIC AB 4-0 SH 27 (SUTURE)
SUT VIC AB 4-0 SH 27XANBCTRL (SUTURE) IMPLANT
SYR CONTROL 10ML (SYRINGE) ×4 IMPLANT
SYRINGE 10CC LL (SYRINGE) ×4 IMPLANT
TROCAR ENDO BLADELESS 11MM (ENDOMECHANICALS) ×4 IMPLANT
TROCAR XCEL NON-BLD 5MMX100MML (ENDOMECHANICALS) ×4 IMPLANT
TROCAR XCEL UNIV SLVE 11M 100M (ENDOMECHANICALS) ×4 IMPLANT
TUBING INSUF HEATED (TUBING) ×4 IMPLANT
TUBING INSUFFLATOR HI FLOW (MISCELLANEOUS) ×4 IMPLANT

## 2016-10-27 NOTE — Progress Notes (Signed)
Lab back in to recollect for abo rh.

## 2016-10-27 NOTE — Progress Notes (Signed)
Patient ID: Paula Estrada, female   DOB: Dec 21, 1970, 47 y.o.   MRN: [id number] DOS no c/o . Pain in good control  Reviewed surgery with pt .  VSS  Good urine output  Anticipate d/s in am

## 2016-10-27 NOTE — Anesthesia Procedure Notes (Signed)
Procedure Name: Intubation Performed by: Shamyra Farias Pre-anesthesia Checklist: Patient identified, Patient being monitored, Timeout performed, Emergency Drugs available and Suction available Patient Re-evaluated:Patient Re-evaluated prior to induction Oxygen Delivery Method: Circle system utilized Preoxygenation: Pre-oxygenation with 100% oxygen Induction Type: IV induction Ventilation: Mask ventilation without difficulty Laryngoscope Size: Miller and 2 Grade View: Grade I Tube type: Oral Tube size: 7.0 mm Number of attempts: 1 Airway Equipment and Method: Stylet Placement Confirmation: ETT inserted through vocal cords under direct vision,  positive ETCO2 and breath sounds checked- equal and bilateral Secured at: 21 cm Tube secured with: Tape Dental Injury: Teeth and Oropharynx as per pre-operative assessment        

## 2016-10-27 NOTE — Anesthesia Post-op Follow-up Note (Signed)
Anesthesia QCDR form completed.        

## 2016-10-27 NOTE — Anesthesia Postprocedure Evaluation (Signed)
Anesthesia Post Note  Patient: Paula Estrada  Procedure(s) Performed: Procedure(s) (LRB): LAPAROSCOPIC SUPRACERVICAL HYSTERECTOMY (N/A) LAPAROSCOPIC BILATERAL SALPINGECTOMY (Bilateral) LAPAROSCOPIC ASSISTED VAGINAL HYSTERECTOMY (N/A)  Patient location during evaluation: PACU Anesthesia Type: General Level of consciousness: awake and alert and oriented Pain management: pain level controlled Vital Signs Assessment: post-procedure vital signs reviewed and stable Respiratory status: spontaneous breathing Cardiovascular status: blood pressure returned to baseline Anesthetic complications: no     Last Vitals:  Vitals:   10/27/16 1243 10/27/16 1414  BP: 114/73 138/83  Pulse: 88 97  Resp: 18 16  Temp: 36.8 C 36.6 C  SpO2: 97% 98%    Last Pain:  Vitals:   10/27/16 1454  TempSrc:   PainSc: 3                  [name]

## 2016-10-27 NOTE — Brief Op Note (Signed)
10/27/2016  9:39 AM  PATIENT:  Paula Estrada  46 y.o. female  PRE-OPERATIVE DIAGNOSIS:  Menorrhagia, chronic pelvic pain   POST-OPERATIVE DIAGNOSIS:  Menorrhagia, endometriosis  PROCEDURE:  LAVH , BSO  SURGEON:  Surgeon(s) and Role:    * Kiran Carline, Gwen Her, MD - Primary    * Benjaman Kindler, MD  PHYSICIAN ASSISTANT: beasley   ASSISTANTS:  Verdell Carmine , PA student  ANESTHESIA:   general  EBL:  Total I/O In: 800 [I.V.:800] Out: 675 [Urine:600; Blood:75]  BLOOD ADMINISTERED:none  DRAINS: Urinary Catheter (Foley)   LOCAL MEDICATIONS USED:  MARCAINE    and LIDOCAINE   SPECIMEN:  Source of Specimen:  cervix , uterus , bilateral fallopian tubes and ovaries   DISPOSITION OF SPECIMEN:  PATHOLOGY  COUNTS:  YES  TOURNIQUET:  * No tourniquets in log *  DICTATION: .Other Dictation: Dictation Number verbal  PLAN OF CARE: Admit for overnight observation  PATIENT DISPOSITION:  PACU - hemodynamically stable.   Delay start of Pharmacological VTE agent (>24hrs) due to surgical blood loss or risk of bleeding: not applicable

## 2016-10-27 NOTE — Progress Notes (Signed)
Pt ready for [facility] and bilateral salpingectomy , possible right oophorectomy , possible BSO and LAVH if endometriosis is noted   NPO   labs reviewed . All questions answered

## 2016-10-27 NOTE — Transfer of Care (Signed)
Immediate Anesthesia Transfer of Care Note  Patient: Paula Estrada  Procedure(s) Performed: Procedure(s): LAPAROSCOPIC SUPRACERVICAL HYSTERECTOMY (N/A) LAPAROSCOPIC BILATERAL SALPINGECTOMY (Bilateral) LAPAROSCOPIC ASSISTED VAGINAL HYSTERECTOMY (N/A)  Patient Location: PACU  Anesthesia Type:General  Level of Consciousness: sedated  Airway & Oxygen Therapy: Patient Spontanous Breathing and Patient connected to face mask oxygen  Post-op Assessment: Report given to RN and Post -op Vital signs reviewed and stable  Post vital signs: Reviewed  Last Vitals:  Vitals:   10/27/16 0616 10/27/16 0952  BP: 136/89 124/80  Pulse: 95   Resp: 16 18  Temp: 36.7 C 36.5 C  SpO2: 96% 99%    Last Pain:  Vitals:   10/27/16 0616  TempSrc: Oral         Complications: No apparent anesthesia complications

## 2016-10-27 NOTE — Anesthesia Preprocedure Evaluation (Signed)
Anesthesia Evaluation  Patient identified by MRN, date of birth, ID band Patient awake    Reviewed: Allergy & Precautions, NPO status , Patient's Chart, lab work & pertinent test results  Airway Mallampati: II  TM Distance: >3 FB     Dental   Pulmonary neg pulmonary ROS,    Pulmonary exam normal        Cardiovascular negative cardio ROS Normal cardiovascular exam+ Valvular Problems/Murmurs      Neuro/Psych  Headaches, negative psych ROS   GI/Hepatic Neg liver ROS, GERD  ,  Endo/Other  negative endocrine ROS  Renal/GU negative Renal ROS  negative genitourinary   Musculoskeletal  (+) Arthritis , Osteoarthritis,    Abdominal Normal abdominal exam  (+)   Peds negative pediatric ROS (+)  Hematology negative hematology ROS (+)   Anesthesia Other Findings   Reproductive/Obstetrics                             Anesthesia Physical Anesthesia Plan  ASA: II  Anesthesia Plan: General   Post-op Pain Management:    Induction: Intravenous  PONV Risk Score and Plan:   Airway Management Planned: Oral ETT  Additional Equipment:   Intra-op Plan:   Post-operative Plan: Extubation in OR  Informed Consent: I have reviewed the patients History and Physical, chart, labs and discussed the procedure including the risks, benefits and alternatives for the proposed anesthesia with the patient or authorized representative who has indicated his/her understanding and acceptance.   Dental advisory given  Plan Discussed with: CRNA and Surgeon  Anesthesia Plan Comments:         Anesthesia Quick Evaluation

## 2016-10-28 DIAGNOSIS — N921 Excessive and frequent menstruation with irregular cycle: Secondary | ICD-10-CM | POA: Diagnosis not present

## 2016-10-28 LAB — BASIC METABOLIC PANEL
ANION GAP: 7 (ref 5–15)
BUN: 10 mg/dL (ref 6–20)
CALCIUM: 8.4 mg/dL — AB (ref 8.9–10.3)
CO2: 26 mmol/L (ref 22–32)
CREATININE: 0.78 mg/dL (ref 0.44–1.00)
Chloride: 107 mmol/L (ref 101–111)
GLUCOSE: 113 mg/dL — AB (ref 65–99)
Potassium: 3.8 mmol/L (ref 3.5–5.1)
Sodium: 140 mmol/L (ref 135–145)

## 2016-10-28 LAB — CBC
HCT: 37.8 % (ref 35.0–47.0)
Hemoglobin: 12.8 g/dL (ref 12.0–16.0)
MCH: 28.8 pg (ref 26.0–34.0)
MCHC: 33.9 g/dL (ref 32.0–36.0)
MCV: 85.1 fL (ref 80.0–100.0)
PLATELETS: 306 10*3/uL (ref 150–440)
RBC: 4.44 MIL/uL (ref 3.80–5.20)
RDW: 12.9 % (ref 11.5–14.5)
WBC: 18.1 10*3/uL — ABNORMAL HIGH (ref 3.6–11.0)

## 2016-10-28 MED ORDER — SIMETHICONE 80 MG PO CHEW: 80.0000 mg | CHEWABLE_TABLET | Freq: Four times a day (QID) | ORAL | 0 refills | Status: AC | PRN

## 2016-10-28 MED ORDER — OXYCODONE-ACETAMINOPHEN 5-325 MG PO TABS: 1.0000 | ORAL_TABLET | ORAL | 0 refills | Status: AC | PRN

## 2016-10-28 MED ORDER — ONDANSETRON HCL 4 MG PO TABS: 4.0000 mg | ORAL_TABLET | Freq: Four times a day (QID) | ORAL | 0 refills | Status: AC | PRN

## 2016-10-28 MED ORDER — IBUPROFEN 800 MG PO TABS: 800.0000 mg | ORAL_TABLET | Freq: Three times a day (TID) | ORAL | 0 refills | Status: AC | PRN

## 2016-10-28 MED ORDER — DOCUSATE SODIUM 100 MG PO CAPS: 100.0000 mg | ORAL_CAPSULE | Freq: Every day | ORAL | 2 refills | Status: AC | PRN

## 2016-10-28 NOTE — Op Note (Signed)
Paula Estrada, Paula Estrada NO.:  [id number]  MEDICAL RECORD NO.:  [id number]  LOCATION:                                 FACILITY:  PHYSICIAN:  Laverta Baltimore, MD     DATE OF BIRTH:  DATE OF PROCEDURE:  10/27/2016 DATE OF DISCHARGE:                              OPERATIVE REPORT   PREOPERATIVE DIAGNOSIS: 1. Menorrhagia. 2. Chronic pelvic pain.  POSTOPERATIVE DIAGNOSIS: 1. Menorrhagia. 2. Chronic pelvic pain. 3. Endometriosis.  PROCEDURE PERFORMED:  Laparoscopic-assisted vaginal hysterectomy with bilateral salpingo-oophorectomy.  SURGEON:  Laverta Baltimore, MD  ANESTHESIA:  General endotracheal anesthesia.  SURGEON:  Laverta Baltimore, MD.  FIRST ASSISTANT:  Leafy Ro.  SECOND ASSISTANT:  PA student, Eston Esters.  INDICATION:  [name] with worsening menorrhagia with a negative workup.  The patient has a longstanding history of pelvic pain preceding her menstrual cycle consistent with endometriosis.  FINDINGS:  Ovarian mass consistent with redundant fallopian tube attached to the distal portion of the right ovary.  Powder burn areas of the mesosalpinx and of the ovarian stroma noted.  DESCRIPTION OF PROCEDURE:  After adequate general endotracheal anesthesia, the patient was placed in dorsal supine position.  The patient was prepped and draped in normal sterile fashion.  She did receive 2 g IV cefoxitin prior to commencement of the case.  A time-out was performed.  A 12 mm infraumbilical incision was made after injecting with 0.5% Marcaine.  The laparoscope was advanced into the abdominal cavity under direct visualization with the Optiview cannula.  Second port was placed left lower quadrant, 3 cm medial to the left anterior iliac spine and the trocar was advanced under direct visualization. Third port site was placed right lower quadrant again 3 cm medial to the right anterior iliac spine, 5 mm port was advanced under  direct visualization.  The patient was placed in Trendelenburg.  Initial impression revealed the findings as above with areas consistent with the right ovary and right mesosalpinx consistent with endometriosis.  A looped fleshy mass approximately 2 x 2 cm was noted to be attached to the distal portion of the right ovary.  The left infundibulopelvic ligament was cauterized and transected with Harmonic scalpel and similarly the right infundibulopelvic ligament was cauterized and transected, and the mesosalpinx were bilaterally transected to the uterus.  The round ligaments were bilaterally transected and the anterior peritoneum was opened to create a bladder flap.  Uterine arteries were bilaterally skeletonized, cauterized, and transected with harmonic scalpel.  Good hemostasis was noted.  The procedure then continued vaginally where the anterior and posterior cervix were grasped with thyroid tenacula.  Cervix was circumferentially injected with 1% lidocaine with epinephrine.  A posterior colpotomy incision was made upon entry into the posterior cul-de-sac.  Uterosacral ligaments were bilaterally clamped, transected, suture ligated with 0 Vicryl suture and tagged for later identification.  Anterior cervix was circumferentially incised with the Bovie.  The anterior cul-de-sac was entered without difficulty and the cardinal ligaments were then bilaterally clamped, transected, suture ligated with 0 Vicryl.  One additional bite in the broad ligament was performed bilaterally and the uterus, cervix, fallopian tubes, and ovaries were then delivered.  Good  hemostasis was noted.  The vaginal cuff was then closed with a running 0 Vicryl suture. Good approximation of edges.  Good hemostasis was noted.  The patient's abdomen was then re-insufflated with carbon dioxide.  Good hemostasis was noted.  Pressure was lowered to 7 mmHg and there was no additional bleeding.  Upper abdomen appeared normal.  The  infraumbilical and left lower port sites were closed with 2 layers, fascial layer with 2-0 Vicryl suture and all 3 incisions were closed with interrupted 4-0 Vicryl suture.  Sterile dressings were applied.  Of note, there was normal peristaltic activity of bilateral ureters prior to the first incision, and again post hysterectomy, there was normal ureteral peristaltic activity bilaterally.  The patient tolerated the procedure well.  ESTIMATED BLOOD LOSS:  75 mL.  INTRAOPERATIVE FLUIDS:  800 mL.  URINE OUTPUT:  600 mL.  The patient was taken to recovery room in good condition.    ______________________________ Laverta Baltimore, MD   ______________________________ Laverta Baltimore, MD    TS/MEDQ  D:  10/27/2016  T:  10/27/2016  Job:  [id number]

## 2016-10-28 NOTE — Progress Notes (Signed)
Discharged via wheelchair escorted by mother and auxilary.

## 2016-10-28 NOTE — Discharge Summary (Signed)
Physician Discharge Summary  Patient ID: Paula Estrada MRN: [id number] DOB/AGE: Mar 26, 1970 46 y.o.  Admit date: 10/27/2016 Discharge date: 10/28/2016  Admission [facility] , [facility]  Discharge Diagnoses:  Active Problems:   Post-operative state   Discharged Condition: good  Hospital Course: underwent an uncomplicated LAVH / BSO   Consults: None  Significant Diagnostic Studies: labs:  Results for orders placed or performed during the hospital encounter of 10/27/16 (from the past 24 hour(s))  CBC     Status: Abnormal   Collection Time: 10/28/16  4:49 AM  Result Value Ref Range   WBC 18.1 (H) 3.6 - 11.0 K/uL   RBC 4.44 3.80 - 5.20 MIL/uL   Hemoglobin 12.8 12.0 - 16.0 g/dL   HCT 37.8 35.0 - 47.0 %   MCV 85.1 80.0 - 100.0 fL   MCH 28.8 26.0 - 34.0 pg   MCHC 33.9 32.0 - 36.0 g/dL   RDW 12.9 11.5 - 14.5 %   Platelets 306 150 - 440 K/uL  Basic metabolic panel     Status: Abnormal   Collection Time: 10/28/16  4:49 AM  Result Value Ref Range   Sodium 140 135 - 145 mmol/L   Potassium 3.8 3.5 - 5.1 mmol/L   Chloride 107 101 - 111 mmol/L   CO2 26 22 - 32 mmol/L   Glucose, Bld 113 (H) 65 - 99 mg/dL   BUN 10 6 - 20 mg/dL   Creatinine, Ser 0.78 0.44 - 1.00 mg/dL   Calcium 8.4 (L) 8.9 - 10.3 mg/dL   GFR calc non Af Amer >60 >60 mL/min   GFR calc Af Amer >60 >60 mL/min   Anion gap 7 5 - 15    Treatments: surgery: as above  Discharge Exam: Blood pressure (!) 145/92, pulse 88, temperature 98.5 F (36.9 C), temperature source Oral, resp. rate 18, height 5\' 3"  (1.6 m), weight 90 kg (198 lb 8 oz), last menstrual period 10/15/2016, SpO2 98 %. General appearance: alert Head: Normocephalic, without obvious abnormality, atraumatic Lungs CTA  CV RRR  Incision C/D/I  Abd: soft non distended  Disposition: 01-Home or Self Care  Discharge Instructions    Call MD for:    Complete by:  As directed    Heavy vaginal bleeding   Call MD for:  difficulty breathing, headache or  visual disturbances    Complete by:  As directed    Call MD for:  extreme fatigue    Complete by:  As directed    Call MD for:  hives    Complete by:  As directed    Call MD for:  persistant dizziness or light-headedness    Complete by:  As directed    Call MD for:  persistant nausea and vomiting    Complete by:  As directed    Call MD for:  redness, tenderness, or signs of infection (pain, swelling, redness, odor or green/yellow discharge around incision site)    Complete by:  As directed    Call MD for:  severe uncontrolled pain    Complete by:  As directed    Call MD for:  temperature >100.4    Complete by:  As directed    Diet - low sodium heart healthy    Complete by:  As directed    Increase activity slowly    Complete by:  As directed      Allergies as of 10/28/2016   No Known Allergies     Medication List    STOP  taking these medications   progesterone 200 MG capsule Commonly known as:  PROMETRIUM     TAKE these medications   calcium carbonate 500 MG chewable tablet Commonly known as:  TUMS - dosed in mg elemental calcium Chew 1 tablet by mouth as needed for indigestion or heartburn.   citalopram 20 MG tablet Commonly known as:  CELEXA Take 20 mg by mouth at bedtime.   docusate sodium 100 MG capsule Commonly known as:  COLACE Take 1 capsule (100 mg total) by mouth daily as needed.   fluticasone 50 MCG/ACT nasal spray Commonly known as:  FLONASE Place 1 spray into both nostrils daily.   ibuprofen 800 MG tablet Commonly known as:  ADVIL,MOTRIN Take 1 tablet (800 mg total) by mouth every 8 (eight) hours as needed.   loratadine 10 MG tablet Commonly known as:  CLARITIN Take 10 mg by mouth daily.   Melatonin 5 MG Tabs Take 1 tablet by mouth at bedtime.   multivitamin with minerals tablet Take 1 tablet by mouth daily.   nortriptyline 10 MG capsule Commonly known as:  PAMELOR Take 20 mg by mouth at bedtime.   ondansetron 4 MG tablet Commonly known  as:  ZOFRAN Take 1 tablet (4 mg total) by mouth every 6 (six) hours as needed for nausea.   oxyCODONE-acetaminophen 5-325 MG tablet Commonly known as:  PERCOCET/ROXICET Take 1-2 tablets by mouth every 4 (four) hours as needed (moderate to severe pain (when tolerating fluids)).   PROBIOTIC DAILY PO Take 1 capsule by mouth daily.   simethicone 80 MG chewable tablet Commonly known as:  MYLICON Chew 1 tablet (80 mg total) by mouth 4 (four) times daily as needed for flatulence.   vitamin C 500 MG tablet Commonly known as:  ASCORBIC ACID Take 500 mg by mouth daily.      Follow-up Information    Schermerhorn, Gwen Her, MD Follow up in 2 week(s).   Specialty:  Obstetrics and Gynecology Why:  post op Contact information: [address] [place] [address] [phone]           Signed: Gwen Her Schermerhorn 10/28/2016, 9:12 AM

## 2016-10-28 NOTE — Progress Notes (Signed)
10/28/2016 9:52 AM  BP (!) 145/92 Comment: nurse Orie Rout notified  Pulse 88   Temp 98.5 F (36.9 C) (Oral)   Resp 18   Ht 5\' 3"  (160 cm)   Wt 198 lb 8 oz (90039 g)   LMP 10/15/2016 (Exact Date)   SpO2 98%   BMI 35.16 kg/m  Patient discharged per MD orders. Discharge instructions reviewed with patient and patient verbalized understanding. IV removed per policy. Prescriptions discussed and given to patient. Patient getting dressed and gathering belongings. To call RN when ready to be discharged.  Almedia Balls, RN

## 2016-10-29 LAB — SURGICAL PATHOLOGY

## 2017-06-10 ENCOUNTER — Other Ambulatory Visit: Payer: Self-pay | Admitting: Obstetrics and Gynecology

## 2017-06-10 DIAGNOSIS — Z1231 Encounter for screening mammogram for malignant neoplasm of breast: Secondary | ICD-10-CM

## 2017-06-22 ENCOUNTER — Encounter: Payer: Self-pay | Admitting: Radiology

## 2017-06-22 ENCOUNTER — Ambulatory Visit
Admission: RE | Admit: 2017-06-22 | Discharge: 2017-06-22 | Disposition: A | Discharge status: Home / Self Care | Payer: BLUE CROSS/BLUE SHIELD | Source: Ambulatory Visit | Attending: Obstetrics and Gynecology | Admitting: Obstetrics and Gynecology

## 2017-06-22 DIAGNOSIS — Z1231 Encounter for screening mammogram for malignant neoplasm of breast: Secondary | ICD-10-CM | POA: Insufficient documentation

## 2017-08-21 ENCOUNTER — Encounter: Payer: BLUE CROSS/BLUE SHIELD | Attending: Obstetrics and Gynecology | Admitting: Dietician

## 2017-08-21 ENCOUNTER — Encounter: Payer: Self-pay | Admitting: Dietician

## 2017-08-21 VITALS — Ht 63.0 in | Wt 177.3 lb

## 2017-08-21 DIAGNOSIS — R7302 Impaired glucose tolerance (oral): Secondary | ICD-10-CM

## 2017-08-21 NOTE — Patient Instructions (Addendum)
   Low sugar on the nutrition facts label: 10g or less per serving. In the ingredient list, if there are ingredients ending in "ose" this is likely added sugar  Have several options for healthy snacks on hand for when on-the-go. Ideally, that contain protein / healthy fats and/or fiber. These will help you feel more full and satisfied. Try not to go more than 4 hours without something to eat to avoid over-eating and cravings. Healthy fats include chia seeds, flax seeds, avocado, eggs, olives and olive oil, tuna, trout, or salmon  Have foods you enjoy in moderation (20%), but make about 80% of your food choices nutrient-dense, whole food based options  Use the plate method or the hand portion guide to help plan meals. Eat foods that make you feel satisfied at meal times to prevent cravings later in the day. You may find that larger portions at a meal or snack earlier in the day help to prevent over-snacking later in the day.

## 2017-08-21 NOTE — Progress Notes (Signed)
Medical Nutrition Therapy: Visit start time: 0830  end time: 0930  Assessment:  Diagnosis: impaired glucose tolerance Past medical history: Depression, Kidney stones, Stomach ulcer, HTN- Psychosocial issues/ stress concerns: history of depression  Preferred learning method:  Nicki [place] . Hands-on  Current weight: 177.3lb  Height: 5\' 3"  Medications, supplements: MVI, Vitamin C, Probiotic, Estradiol, HCTZ, Citalopram, Amlodipine besylate  Progress and evaluation: Patient with GLU 113 H (07/06/17)- unchanged from October 2018. Other abnormal labs Chol 207 H, TG 215 H (07/06/17). She has started to walk more regularly and has re-started the Weight Watchers program in order to get "back on track." Reports losing 25# starting last September when starting [employer] for the first time, but gained some of the weight back during the holiday season. She was last at her goal wt 10 years ago. Currently she struggles most with snacking and cravings in the afternoon and evening. She feels that she is an emotional eater and has felt higher stress than usual in recent years. 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 The family both cooks at home and eats out at various locations.  Physical activity: yoga and walking 2 days per week for 73min - 1hour each. Plans to increase number of days she walks now that school is out for the summer.  Dietary Intake:  Usual eating pattern includes 3 meals and 2-3 snacks per day. Dining out frequency: 5 meals per week.  Breakfast: oatmeal with fruit, an egg + fruit, yogurt or biscuits infrequently Snack: at school: fruit, goldfish, cheese & crackers, Atkins bar Lunch: grilled chicken salad, pb&j, [place] chicken with pita and side salad Snack (4pm): fruit, nabs, goldfish, ice cream Supper: pasta or another starch, meat, at least one vegetable,  cheeseburgers when out, pizza on Fridays Snack: similar to above Beverages: coffee + truvia and SF creamer, water, one diet soda   Nutrition Care Education: Topics covered: fiber / protein and satiety, healthy and unhealthy fats, normal range for blood glucose, emotional eating, portion sizes and portion control methods, healthy snack ideas, identifying low sugar items using the nutrition facts label, plate method for planning meals Basic nutrition: basic food groups, appropriate nutrient balance, appropriate meal and snack schedule, general nutrition guidelines    Weight control: determining reasonable weight goal, behavioral changes for weight loss Advanced nutrition: recipe modification, cooking techniques, dining out, food label reading Other lifestyle changes: benefits of making changes, increasing motivation, readiness for change, identifying habits that need to change  Nutritional Diagnosis:  [place]-2.2 Altered nutrition-related laboratory As related to impaired glucose tolerance.  As evidenced by GLU 113 H.  Intervention: Discussion as noted above. She will work on being more consistent with portion control / consuming proper portion sizes at meals and snacks. She will also try to have healthy snacks on hand that will help to keep her satisfied and prevent going too long without eating, causing over-eating and cravings later in the day. Seeking out foods with protein, fiber and healthy fats will help increase her satiety and fullness.  Education Materials given:  . Proper portion guide . Plate method and Mediterranean diet packet . Goals/ instructions  Learner/ who was taught:  . Patient  Level of understanding: Marland Kitchen Verbalizes/ demonstrates competency  Demonstrated degree of understanding via:   Teach back Learning barriers: Marland Kitchen Motivation lacking  Willingness to learn/ readiness for  change: . Eager, change in progress  Monitoring and Evaluation:  Dietary intake, exercise, blood  glucose lab values, and body weight      follow up: prn

## 2018-07-26 ENCOUNTER — Other Ambulatory Visit: Payer: Self-pay | Admitting: Obstetrics and Gynecology

## 2018-07-26 DIAGNOSIS — Z1231 Encounter for screening mammogram for malignant neoplasm of breast: Secondary | ICD-10-CM

## 2018-08-27 ENCOUNTER — Other Ambulatory Visit: Payer: Self-pay

## 2018-08-27 ENCOUNTER — Ambulatory Visit
Admission: RE | Admit: 2018-08-27 | Discharge: 2018-08-27 | Disposition: A | Discharge status: Home / Self Care | Payer: PRIVATE HEALTH INSURANCE | Source: Ambulatory Visit | Attending: Obstetrics and Gynecology | Admitting: Obstetrics and Gynecology

## 2018-08-27 DIAGNOSIS — Z1231 Encounter for screening mammogram for malignant neoplasm of breast: Secondary | ICD-10-CM | POA: Insufficient documentation

## 2018-10-14 ENCOUNTER — Other Ambulatory Visit
Admission: RE | Admit: 2018-10-14 | Discharge: 2018-10-14 | Disposition: A | Discharge status: Home / Self Care | Payer: BLUE CROSS/BLUE SHIELD | Source: Ambulatory Visit | Attending: Unknown Physician Specialty | Admitting: Unknown Physician Specialty

## 2018-10-22 ENCOUNTER — Other Ambulatory Visit
Admission: RE | Admit: 2018-10-22 | Discharge: 2018-10-22 | Disposition: A | Discharge status: Home / Self Care | Payer: PRIVATE HEALTH INSURANCE | Source: Ambulatory Visit | Attending: Internal Medicine | Admitting: Internal Medicine

## 2018-10-22 ENCOUNTER — Other Ambulatory Visit: Payer: Self-pay

## 2018-10-22 DIAGNOSIS — Z20828 Contact with and (suspected) exposure to other viral communicable diseases: Secondary | ICD-10-CM | POA: Diagnosis not present

## 2018-10-23 LAB — SARS CORONAVIRUS 2 (TAT 6-24 HRS): SARS Coronavirus 2: NEGATIVE

## 2018-10-26 ENCOUNTER — Encounter: Payer: Self-pay | Admitting: *Deleted

## 2018-10-27 ENCOUNTER — Encounter
Admission: RE | Disposition: A | Discharge status: Home / Self Care | Payer: Self-pay | Source: Home / Self Care | Attending: Internal Medicine

## 2018-10-27 ENCOUNTER — Other Ambulatory Visit: Payer: Self-pay

## 2018-10-27 ENCOUNTER — Ambulatory Visit: Payer: PRIVATE HEALTH INSURANCE | Admitting: Certified Registered"

## 2018-10-27 ENCOUNTER — Ambulatory Visit
Admission: RE | Admit: 2018-10-27 | Discharge: 2018-10-27 | Disposition: A | Discharge status: Home / Self Care | Payer: PRIVATE HEALTH INSURANCE | Attending: Internal Medicine | Admitting: Internal Medicine

## 2018-10-27 ENCOUNTER — Encounter: Payer: Self-pay | Admitting: *Deleted

## 2018-10-27 DIAGNOSIS — Z79890 Hormone replacement therapy (postmenopausal): Secondary | ICD-10-CM | POA: Insufficient documentation

## 2018-10-27 DIAGNOSIS — K219 Gastro-esophageal reflux disease without esophagitis: Secondary | ICD-10-CM | POA: Diagnosis not present

## 2018-10-27 DIAGNOSIS — I10 Essential (primary) hypertension: Secondary | ICD-10-CM | POA: Diagnosis not present

## 2018-10-27 DIAGNOSIS — Z1211 Encounter for screening for malignant neoplasm of colon: Secondary | ICD-10-CM | POA: Diagnosis not present

## 2018-10-27 DIAGNOSIS — Z8371 Family history of colonic polyps: Secondary | ICD-10-CM | POA: Diagnosis not present

## 2018-10-27 DIAGNOSIS — F329 Major depressive disorder, single episode, unspecified: Secondary | ICD-10-CM | POA: Diagnosis not present

## 2018-10-27 DIAGNOSIS — R1013 Epigastric pain: Secondary | ICD-10-CM | POA: Diagnosis present

## 2018-10-27 DIAGNOSIS — K2950 Unspecified chronic gastritis without bleeding: Secondary | ICD-10-CM | POA: Insufficient documentation

## 2018-10-27 DIAGNOSIS — G43909 Migraine, unspecified, not intractable, without status migrainosus: Secondary | ICD-10-CM | POA: Diagnosis not present

## 2018-10-27 DIAGNOSIS — Z79899 Other long term (current) drug therapy: Secondary | ICD-10-CM | POA: Diagnosis not present

## 2018-10-27 DIAGNOSIS — K640 First degree hemorrhoids: Secondary | ICD-10-CM | POA: Insufficient documentation

## 2018-10-27 DIAGNOSIS — K5730 Diverticulosis of large intestine without perforation or abscess without bleeding: Secondary | ICD-10-CM | POA: Insufficient documentation

## 2018-10-27 HISTORY — DX: Gastric ulcer, unspecified as acute or chronic, without hemorrhage or perforation: K25.9

## 2018-10-27 HISTORY — PX: COLONOSCOPY WITH PROPOFOL: SHX5780

## 2018-10-27 HISTORY — DX: Chronic kidney disease, unspecified: N18.9

## 2018-10-27 HISTORY — PX: ESOPHAGOGASTRODUODENOSCOPY (EGD) WITH PROPOFOL: SHX5813

## 2018-10-27 HISTORY — DX: Essential (primary) hypertension: I10

## 2018-10-27 HISTORY — DX: Depression, unspecified: F32.A

## 2018-10-27 SURGERY — ESOPHAGOGASTRODUODENOSCOPY (EGD) WITH PROPOFOL
Anesthesia: General

## 2018-10-27 MED ORDER — PROPOFOL 10 MG/ML IV BOLUS
INTRAVENOUS | Status: DC | PRN
  Administered 2018-10-27 (×10): 50 mg via INTRAVENOUS

## 2018-10-27 MED ORDER — GLYCOPYRROLATE 0.2 MG/ML IJ SOLN
INTRAMUSCULAR | Status: DC | PRN
  Administered 2018-10-27: 0.2 mg via INTRAVENOUS

## 2018-10-27 MED ORDER — SODIUM CHLORIDE 0.9 % IV SOLN
INTRAVENOUS | Status: DC
  Administered 2018-10-27: 09:00:00 via INTRAVENOUS

## 2018-10-27 NOTE — Op Note (Signed)
[facility] Patient Name: Paula Estrada Procedure Date: 10/27/2018 8:47 AM MRN: [id number] Account #: [id number] Date of Birth: [birth year] Admit Type: Outpatient Age: 48 Room: [facility] ENDO ROOM 3 Gender: Female Note Status: Finalized Procedure:            Upper GI endoscopy Indications:          Epigastric abdominal pain Providers:            Benay Pike. Alice Reichert MD, MD Referring MD:         Boykin Nearing, MD (Referring MD) Medicines:            Propofol per Anesthesia Complications:        No immediate complications. Procedure:            Pre-Anesthesia Assessment:                       - The risks and benefits of the procedure and the                        sedation options and risks were discussed with the                        patient. All questions were answered and informed                        consent was obtained.                       - Patient identification and proposed procedure were                        verified prior to the procedure by the nurse. The                        procedure was verified in the procedure room.                       - ASA Grade Assessment: II - A patient with mild                        systemic disease.                       - After reviewing the risks and benefits, the patient                        was deemed in satisfactory condition to undergo the                        procedure.                       After obtaining informed consent, the endoscope was                        passed under direct vision. Throughout the procedure,                        the patient's blood pressure, pulse, and oxygen  saturations were monitored continuously. The Endoscope                        was introduced through the mouth, and advanced to the                        third part of duodenum. The upper GI endoscopy was                        accomplished without difficulty. The patient  tolerated                        the procedure well. Findings:      The examined esophagus was normal.      Localized mild inflammation characterized by erythema was found in the       gastric antrum. Biopsies were taken with a cold forceps for Helicobacter       pylori testing.      The cardia and gastric fundus were normal on retroflexion.      The examined duodenum was normal.      The exam was otherwise without abnormality. Impression:           - Normal esophagus.                       - Gastritis. Biopsied.                       - Normal examined duodenum.                       - The examination was otherwise normal. Recommendation:       - Await pathology results.                       - Proceed with colonoscopy Procedure Code(s):    --- Professional ---                       [id number], Esophagogastroduodenoscopy, flexible, transoral;                        with biopsy, single or multiple Diagnosis Code(s):    --- Professional ---                       R10.13, Epigastric pain                       K29.70, Gastritis, unspecified, without bleeding CPT copyright 2019 American Medical Association. All rights reserved. The codes documented in this report are preliminary and upon coder review may  be revised to meet current compliance requirements. Efrain Sella MD, MD 10/27/2018 9:02:15 AM This report has been signed electronically. Number of Addenda: 0 Note Initiated On: 10/27/2018 8:47 AM Estimated Blood Loss: Estimated blood loss: none.      [facility]

## 2018-10-27 NOTE — Op Note (Signed)
[facility] Patient Name: Paula Estrada Procedure Date: 10/27/2018 8:46 AM MRN: [id number] Account #: [id number] Date of Birth: 02/26/1971 Admit Type: Outpatient Age: 48 Room: [facility] ENDO ROOM 3 Gender: Female Note Status: Finalized Procedure:            Colonoscopy Indications:          Colon cancer screening in patient at increased risk:                        Family history of 1st-degree relative with colon polyps Providers:            Benay Pike. [place] MD, MD Medicines:            Propofol per Anesthesia Complications:        No immediate complications. Procedure:            Pre-Anesthesia Assessment:                       - The risks and benefits of the procedure and the                        sedation options and risks were discussed with the                        patient. All questions were answered and informed                        consent was obtained.                       - Patient identification and proposed procedure were                        verified prior to the procedure by the nurse. The                        procedure was verified in the procedure room.                       - ASA Grade Assessment: II - A patient with mild                        systemic disease.                       - After reviewing the risks and benefits, the patient                        was deemed in satisfactory condition to undergo the                        procedure.                       After obtaining informed consent, the colonoscope was                        passed under direct vision. Throughout the procedure,                        the patient's blood pressure, pulse, and oxygen  saturations were monitored continuously. The                        Colonoscope was introduced through the anus and                        advanced to the the cecum, identified by appendiceal                        orifice and ileocecal valve.  The colonoscopy was                        performed without difficulty. The patient tolerated the                        procedure well. The quality of the bowel preparation                        was good. Findings:      The perianal and digital rectal examinations were normal. Pertinent       negatives include normal sphincter tone and no palpable rectal lesions.      Many small-mouthed diverticula were found in the left colon.      Non-bleeding internal hemorrhoids were found during retroflexion. The       hemorrhoids were Grade I (internal hemorrhoids that do not prolapse).      The exam was otherwise without abnormality. Impression:           - Diverticulosis in the left colon.                       - Non-bleeding internal hemorrhoids.                       - The examination was otherwise normal.                       - No specimens collected. Recommendation:       - Await pathology results from EGD, also performed                        today.                       - Patient has a contact number available for                        emergencies. The signs and symptoms of potential                        delayed complications were discussed with the patient.                        Return to normal activities tomorrow. Written discharge                        instructions were provided to the patient.                       - Resume previous diet.                       -  Continue present medications.                       - Repeat colonoscopy in 5 years for screening purposes.                       - Return to physician assistant in 3 months.                       - Please follow up with Tammi Klippel, PA-C for your                        visit to [facility]. Of course, I                        remain available to you if you need any help. Procedure Code(s):    --- Professional ---                       I6270, Colorectal cancer screening; colonoscopy on                         individual at high risk Diagnosis Code(s):    --- Professional ---                       K57.30, Diverticulosis of large intestine without                        perforation or abscess without bleeding                       K64.0, First degree hemorrhoids                       Z83.71, Family history of colonic polyps CPT copyright 2019 American Medical Association. All rights reserved. The codes documented in this report are preliminary and upon coder review may  be revised to meet current compliance requirements. Efrain Sella MD, MD 10/27/2018 9:20:06 AM This report has been signed electronically. Number of Addenda: 0 Note Initiated On: 10/27/2018 8:46 AM Scope Withdrawal Time: 0 hours 6 minutes 36 seconds  Total Procedure Duration: 0 hours 9 minutes 43 seconds  Estimated Blood Loss: Estimated blood loss: none.      [facility]

## 2018-10-27 NOTE — Transfer of Care (Signed)
Immediate Anesthesia Transfer of Care Note  Patient: Paula Estrada  Procedure(s) Performed: ESOPHAGOGASTRODUODENOSCOPY (EGD) WITH PROPOFOL (N/A ) COLONOSCOPY WITH PROPOFOL (N/A )  Patient Location: Endoscopy Unit  Anesthesia Type:General  Level of Consciousness: drowsy, patient cooperative and responds to stimulation  Airway & Oxygen Therapy: Patient Spontanous Breathing and Patient connected to face mask oxygen  Post-op Assessment: Report given to RN and Post -op Vital signs reviewed and stable  Post vital signs: Reviewed and stable  Last Vitals:  Vitals Value Taken Time  BP 155/136 10/27/18 0918  Temp    Pulse 83 10/27/18 0919  Resp 16 10/27/18 0919  SpO2 99 % 10/27/18 0919  Vitals shown include unvalidated device data.  Last Pain:  Vitals:   10/27/18 0823  TempSrc: Tympanic  PainSc: 0-No pain         Complications: No apparent anesthesia complications

## 2018-10-27 NOTE — H&P (Signed)
Outpatient short stay form Pre-procedure 10/27/2018 8:39 AM Paula Estrada K. Alice Reichert, M.D.  Primary Physician: Laverta Baltimore, M.D.  Reason for visit:  Family history of colon polyps, epigastric pain  History of present illness:  As above. Patient with chronic constipation, colonoscopy naive. Has epigastric pain and admits to taking Ibuprofen at some frequency. No dysphagia, GERD, weight loss. No melena or hematochezia.     Current Facility-Administered Medications:  .  0.9 %  sodium chloride infusion, , Intravenous, Continuous, [place], Benay Pike, MD, Last Rate: 20 mL/hr at 10/27/18 7106  Facility-Administered Medications Ordered in Other Encounters:  .  glycopyrrolate (ROBINUL) injection, , , Anesthesia Intra-op, Fletcher-Harrison, Tawana, CRNA, 0.2 mg at 10/27/18 2694  Medications Prior to Admission  Medication Sig Dispense Refill Last Dose  . amLODipine (NORVASC) 5 MG tablet   11 10/26/2018 at 2200  . calcium carbonate (TUMS - DOSED IN MG ELEMENTAL CALCIUM) 500 MG chewable tablet Chew 1 tablet by mouth as needed for indigestion or heartburn.   Past Month at Unknown time  . Cholecalciferol 25 MCG (1000 UT) capsule Take 1,000 Units by mouth daily.   10/26/2018 at 0800  . citalopram (CELEXA) 20 MG tablet Take 20 mg by mouth at bedtime.   10/26/2018 at 2200  . estradiol (CLIMARA - DOSED IN MG/24 HR) 0.05 mg/24hr patch APPLY 1 PATCH ONTO THE SKIN ONCE A WEEK  10 10/26/2018 at 2200  . estradiol (ESTRACE) 1 MG tablet Take 1 mg by mouth daily.   10/26/2018 at 0800  . fluticasone (FLONASE) 50 MCG/ACT nasal spray Place 2 sprays into both nostrils daily.    10/26/2018 at 0800  . ibuprofen (ADVIL,MOTRIN) 800 MG tablet Take 1 tablet (800 mg total) by mouth every 8 (eight) hours as needed. 60 tablet 0 Past Week at Unknown time  . loratadine (CLARITIN) 10 MG tablet Take 10 mg by mouth daily.    10/26/2018 at 0800  . Melatonin 5 MG TABS Take 1 tablet by mouth at bedtime.   10/26/2018 at 2200  . Multiple  Vitamins-Minerals (MULTIVITAMIN WITH MINERALS) tablet Take 1 tablet by mouth daily.   10/26/2018 at 0800  . nortriptyline (PAMELOR) 10 MG capsule Take 20 mg by mouth at bedtime.   10/26/2018 at 2200  . Omega-3 Fatty Acids (OMEGA 3 500) 500 MG CAPS Take 300-500 mg by mouth daily.     . phentermine (ADIPEX-P) 37.5 MG tablet Take 37.5 mg by mouth daily before breakfast.   10/26/2018 at 0800  . Probiotic Product (PROBIOTIC DAILY PO) Take 1 capsule by mouth daily.   10/26/2018 at 0800  . rizatriptan (MAXALT) 10 MG tablet Take 10 mg by mouth as needed for migraine. May repeat in 2 hours if needed   Past Week at Unknown time  . simethicone (MYLICON) 80 MG chewable tablet Chew 1 tablet (80 mg total) by mouth 4 (four) times daily as needed for flatulence. 30 tablet 0 10/26/2018 at 0800  . vitamin C (ASCORBIC ACID) 500 MG tablet Take 500 mg by mouth daily.   10/26/2018 at 0800  . hydrochlorothiazide (HYDRODIURIL) 25 MG tablet Take by mouth.     . ondansetron (ZOFRAN) 4 MG tablet Take 1 tablet (4 mg total) by mouth every 6 (six) hours as needed for nausea. 20 tablet 0   . oxyCODONE-acetaminophen (PERCOCET/ROXICET) 5-325 MG tablet Take 1-2 tablets by mouth every 4 (four) hours as needed (moderate to severe pain (when tolerating fluids)). (Patient not taking: Reported on 08/21/2017) 30 tablet 0  Allergies  Allergen Reactions  . Lisinopril Other (See Comments)    hypotension     Past Medical History:  Diagnosis Date  . Arthritis    hands, knees ankles  . Chronic kidney disease    kidney stones  . Depression   . Gastric ulcer   . GERD (gastroesophageal reflux disease)    ONLY WITH SPICY FOODS-TUMS PRN  . Headache    migranes-taking Nortriptyline for this  . Heart murmur    asymptomatic  . History of kidney stones    h/o  . Hypertension     Review of systems:  Otherwise negative.    Physical Exam  Gen: Alert, oriented. Appears stated age.  HEENT: [place]/AT. PERRLA. Lungs: CTA, no wheezes. CV:  RR nl S1, S2. Abd: soft, benign, no masses. BS+ Ext: No edema. Pulses 2+    Planned procedures: Proceed with EGD and colonoscopy. The patient understands the nature of the planned procedure, indications, risks, alternatives and potential complications including but not limited to bleeding, infection, perforation, damage to internal organs and possible oversedation/side effects from anesthesia. The patient agrees and gives consent to proceed.  Please refer to procedure notes for findings, recommendations and patient disposition/instructions.     Arlin Sass K. Alice Reichert, M.D. Gastroenterology 10/27/2018  8:39 AM

## 2018-10-27 NOTE — Anesthesia Preprocedure Evaluation (Signed)
Anesthesia Evaluation  Patient identified by MRN, date of birth, ID band Patient awake    Reviewed: Allergy & Precautions, NPO status , Patient's Chart, lab work & pertinent test results  History of Anesthesia Complications Negative for: history of anesthetic complications  Airway Mallampati: II  TM Distance: >3 FB Neck ROM: Full    Dental no notable dental hx.    Pulmonary neg pulmonary ROS, neg sleep apnea, neg COPD,    breath sounds clear to auscultation- rhonchi (-) wheezing      Cardiovascular hypertension, (-) CAD, (-) Past MI, (-) Cardiac Stents and (-) CABG  Rhythm:Regular Rate:Normal - Systolic murmurs and - Diastolic murmurs    Neuro/Psych  Headaches, neg Seizures PSYCHIATRIC DISORDERS Depression    GI/Hepatic Neg liver ROS, PUD, GERD  ,  Endo/Other  negative endocrine ROSneg diabetes  Renal/GU negative Renal ROS     Musculoskeletal  (+) Arthritis ,   Abdominal (+) - obese,   Peds  Hematology negative hematology ROS (+)   Anesthesia Other Findings   Reproductive/Obstetrics                             Anesthesia Physical Anesthesia Plan  ASA: II  Anesthesia Plan: General   Post-op Pain Management:    Induction: Intravenous  PONV Risk Score and Plan: 2 and Propofol infusion  Airway Management Planned: Natural Airway  Additional Equipment:   Intra-op Plan:   Post-operative Plan:   Informed Consent: I have reviewed the patients History and Physical, chart, labs and discussed the procedure including the risks, benefits and alternatives for the proposed anesthesia with the patient or authorized representative who has indicated his/her understanding and acceptance.     Dental advisory given  Plan Discussed with: CRNA and Anesthesiologist  Anesthesia Plan Comments:         Anesthesia Quick Evaluation

## 2018-10-27 NOTE — Anesthesia Post-op Follow-up Note (Signed)
Anesthesia QCDR form completed.        

## 2018-10-27 NOTE — Anesthesia Postprocedure Evaluation (Signed)
Anesthesia Post Note  Patient: Imelda Pillow  Procedure(s) Performed: ESOPHAGOGASTRODUODENOSCOPY (EGD) WITH PROPOFOL (N/A ) COLONOSCOPY WITH PROPOFOL (N/A )  Patient location during evaluation: Endoscopy Anesthesia Type: General Level of consciousness: awake and alert and oriented Pain management: pain level controlled Vital Signs Assessment: post-procedure vital signs reviewed and stable Respiratory status: spontaneous breathing, nonlabored ventilation and respiratory function stable Cardiovascular status: blood pressure returned to baseline and stable Postop Assessment: no signs of nausea or vomiting Anesthetic complications: no     Last Vitals:  Vitals:   10/27/18 0823 10/27/18 0920  BP: 110/83 (!) 91/51  Pulse: 85   Resp: 18   Temp: 36.7 C 36.7 C  SpO2: 98%     Last Pain:  Vitals:   10/27/18 0950  TempSrc:   PainSc: 0-No pain                 Jerryl Holzhauer

## 2018-10-27 NOTE — Interval H&P Note (Signed)
History and Physical Interval Note:  10/27/2018 8:41 AM  Paula Estrada  has presented today for surgery, with the diagnosis of EPIGASTRIC PAIN,FAMILY HX.COLON POLYPS.  The various methods of treatment have been discussed with the patient and family. After consideration of risks, benefits and other options for treatment, the patient has consented to  Procedure(s): ESOPHAGOGASTRODUODENOSCOPY (EGD) WITH PROPOFOL (N/A) COLONOSCOPY WITH PROPOFOL (N/A) as a surgical intervention.  The patient's history has been reviewed, patient examined, no change in status, stable for surgery.  I have reviewed the patient's chart and labs.  Questions were answered to the patient's satisfaction.     [place], [place]

## 2018-10-28 ENCOUNTER — Encounter: Payer: Self-pay | Admitting: Internal Medicine

## 2018-10-28 LAB — SURGICAL PATHOLOGY

## 2019-06-10 ENCOUNTER — Ambulatory Visit: Payer: Self-pay | Attending: Internal Medicine

## 2019-06-10 DIAGNOSIS — Z23 Encounter for immunization: Secondary | ICD-10-CM

## 2019-06-10 NOTE — Progress Notes (Signed)
   Covid-19 Vaccination Clinic  Name:  BRELEE GARARD    MRN: [id number] DOB: Jul 17, 1970  06/10/2019  Ms. Waag was observed post Covid-19 immunization for 15 minutes without incident. She was provided with Vaccine Information Sheet and instruction to access the V-Safe system.   Ms. Judy was instructed to call 911 with any severe reactions post vaccine: Marland Kitchen Difficulty breathing  . Swelling of face and throat  . A fast heartbeat  . A bad rash all over body  . Dizziness and weakness   Immunizations Administered    Name Date Dose VIS Date Route   Pfizer COVID-19 Vaccine 06/10/2019  1:08 PM 0.3 mL 02/25/2019 Intramuscular   Manufacturer: [place]   Lot: [id number]   [place]: [id number]

## 2019-07-06 ENCOUNTER — Ambulatory Visit: Payer: Self-pay | Attending: Internal Medicine

## 2019-07-06 DIAGNOSIS — Z23 Encounter for immunization: Secondary | ICD-10-CM

## 2019-07-06 NOTE — Progress Notes (Signed)
   Covid-19 Vaccination Clinic  Name:  Paula Estrada    MRN: [id number] DOB: 1970-09-18  07/06/2019  [name] was observed post Covid-19 immunization for 15 minutes without incident. She was provided with Vaccine Information Sheet and instruction to access the V-Safe system.   [name] was instructed to call 911 with any severe reactions post vaccine: Marland Kitchen Difficulty breathing  . Swelling of face and throat  . A fast heartbeat  . A bad rash all over body  . Dizziness and weakness   Immunizations Administered    Name Date Dose VIS Date Route   Pfizer COVID-19 Vaccine 07/06/2019  1:11 PM 0.3 mL 05/11/2018 Intramuscular   Manufacturer: [place]   Lot: [id number]   [place]: [id number]

## 2019-08-18 ENCOUNTER — Other Ambulatory Visit: Payer: Self-pay | Admitting: Obstetrics and Gynecology

## 2020-01-23 ENCOUNTER — Other Ambulatory Visit: Payer: Self-pay | Admitting: Obstetrics and Gynecology

## 2020-01-23 DIAGNOSIS — Z1231 Encounter for screening mammogram for malignant neoplasm of breast: Secondary | ICD-10-CM

## 2020-03-02 ENCOUNTER — Other Ambulatory Visit: Payer: Self-pay

## 2020-03-02 ENCOUNTER — Ambulatory Visit
Admission: RE | Admit: 2020-03-02 | Discharge: 2020-03-02 | Disposition: A | Discharge status: Home / Self Care | Payer: Self-pay | Source: Ambulatory Visit | Attending: Obstetrics and Gynecology | Admitting: Obstetrics and Gynecology

## 2020-03-02 DIAGNOSIS — Z1231 Encounter for screening mammogram for malignant neoplasm of breast: Secondary | ICD-10-CM | POA: Insufficient documentation

## 2020-05-09 ENCOUNTER — Other Ambulatory Visit: Payer: Self-pay

## 2020-05-09 ENCOUNTER — Encounter: Payer: Self-pay | Admitting: Emergency Medicine

## 2020-05-09 ENCOUNTER — Emergency Department
Admission: EM | Admit: 2020-05-09 | Discharge: 2020-05-09 | Disposition: A | Discharge status: Home / Self Care | Payer: Self-pay | Attending: Emergency Medicine | Admitting: Emergency Medicine

## 2020-05-09 DIAGNOSIS — R55 Syncope and collapse: Secondary | ICD-10-CM | POA: Insufficient documentation

## 2020-05-09 DIAGNOSIS — G43909 Migraine, unspecified, not intractable, without status migrainosus: Secondary | ICD-10-CM | POA: Insufficient documentation

## 2020-05-09 DIAGNOSIS — Z5321 Procedure and treatment not carried out due to patient leaving prior to being seen by health care provider: Secondary | ICD-10-CM | POA: Insufficient documentation

## 2020-05-09 LAB — BASIC METABOLIC PANEL
Anion gap: 9 (ref 5–15)
BUN: 16 mg/dL (ref 6–20)
CO2: 25 mmol/L (ref 22–32)
Calcium: 9.4 mg/dL (ref 8.9–10.3)
Chloride: 104 mmol/L (ref 98–111)
Creatinine, Ser: 0.81 mg/dL (ref 0.44–1.00)
GFR, Estimated: >60 mL/min (ref >60)
Glucose, Bld: 102 mg/dL — ABNORMAL HIGH (ref 70–99)
Potassium: 3.8 mmol/L (ref 3.5–5.1)
Sodium: 138 mmol/L (ref 135–145)

## 2020-05-09 LAB — CBC
HCT: 39.8 % (ref 36.0–46.0)
Hemoglobin: 13.6 g/dL (ref 12.0–15.0)
MCH: 29 pg (ref 26.0–34.0)
MCHC: 34.2 g/dL (ref 30.0–36.0)
MCV: 84.9 fL (ref 80.0–100.0)
Platelets: 309 10*3/uL (ref 150–400)
RBC: 4.69 MIL/uL (ref 3.87–5.11)
RDW: 12 % (ref 11.5–15.5)
WBC: 5.8 10*3/uL (ref 4.0–10.5)
nRBC: 0 % (ref 0.0–0.2)

## 2020-05-09 MED ORDER — PREDNISONE 20 MG PO TABS
60.0000 mg | ORAL_TABLET | Freq: Once | ORAL | Status: AC
  Administered 2020-05-09: 60 mg via ORAL
  Filled 2020-05-09: qty 3

## 2020-05-09 MED ORDER — KETOROLAC TROMETHAMINE 10 MG PO TABS
10.0000 mg | ORAL_TABLET | Freq: Once | ORAL | Status: AC
  Administered 2020-05-09: 10 mg via ORAL
  Filled 2020-05-09: qty 1

## 2020-05-09 MED ORDER — METOCLOPRAMIDE HCL 10 MG PO TABS
10.0000 mg | ORAL_TABLET | Freq: Once | ORAL | Status: AC
  Administered 2020-05-09: 10 mg via ORAL
  Filled 2020-05-09: qty 1

## 2020-05-09 MED ORDER — DIPHENHYDRAMINE HCL 25 MG PO CAPS
25.0000 mg | ORAL_CAPSULE | Freq: Once | ORAL | Status: AC
  Administered 2020-05-09: 25 mg via ORAL
  Filled 2020-05-09: qty 1

## 2020-05-09 NOTE — ED Triage Notes (Signed)
Pt states she can't wait any longer. Pt ambulatory with steady gait with son.

## 2020-05-09 NOTE — ED Triage Notes (Signed)
Pt to ED via [facility] from a [facility] where she works. Per EMS pt c/o migraine that started yesterday, pt has not taken medication for migraine, EMS reports near syncopal episode due to pain from migraine, EMS reports pt A&O x4.   90 HR 155/97 99% RA   Pt states took some Ibuprofen today without relief. Pt states normally takes Maxalt for her migraines. Pt A&O x4 in triage, VSS at this time. Pt states stood up too fast and almost fainted.

## 2021-04-25 ENCOUNTER — Other Ambulatory Visit: Payer: Self-pay | Admitting: Obstetrics and Gynecology

## 2021-04-25 DIAGNOSIS — Z1231 Encounter for screening mammogram for malignant neoplasm of breast: Secondary | ICD-10-CM

## 2021-06-03 ENCOUNTER — Ambulatory Visit
Admission: RE | Admit: 2021-06-03 | Discharge: 2021-06-03 | Disposition: A | Discharge status: Home / Self Care | Payer: BC Managed Care – PPO | Source: Ambulatory Visit | Attending: Obstetrics and Gynecology | Admitting: Obstetrics and Gynecology

## 2021-06-03 ENCOUNTER — Other Ambulatory Visit: Payer: Self-pay

## 2021-06-03 DIAGNOSIS — Z1231 Encounter for screening mammogram for malignant neoplasm of breast: Secondary | ICD-10-CM | POA: Insufficient documentation

## 2022-06-09 ENCOUNTER — Other Ambulatory Visit: Payer: Self-pay | Admitting: Obstetrics and Gynecology

## 2022-06-09 DIAGNOSIS — Z1231 Encounter for screening mammogram for malignant neoplasm of breast: Secondary | ICD-10-CM

## 2022-06-26 ENCOUNTER — Ambulatory Visit
Admission: RE | Admit: 2022-06-26 | Discharge: 2022-06-26 | Disposition: A | Discharge status: Home / Self Care | Payer: BC Managed Care – PPO | Source: Ambulatory Visit | Attending: Obstetrics and Gynecology | Admitting: Obstetrics and Gynecology

## 2022-06-26 DIAGNOSIS — Z1231 Encounter for screening mammogram for malignant neoplasm of breast: Secondary | ICD-10-CM | POA: Diagnosis present

## 2022-08-14 IMAGING — MG MM DIGITAL SCREENING BILAT W/ TOMO AND CAD
8 series · 8 of 24 positions shown · non-contrast
Comparison: Previous exam(s).

CLINICAL DATA: Screening.

EXAM:
DIGITAL SCREENING BILATERAL MAMMOGRAM WITH TOMOSYNTHESIS AND CAD
TECHNIQUE: Bilateral screening digital craniocaudal and mediolateral oblique
mammograms were obtained. Bilateral screening digital breast
tomosynthesis was performed. The images were evaluated with
computer-aided detection.

[R CC synth-2D]
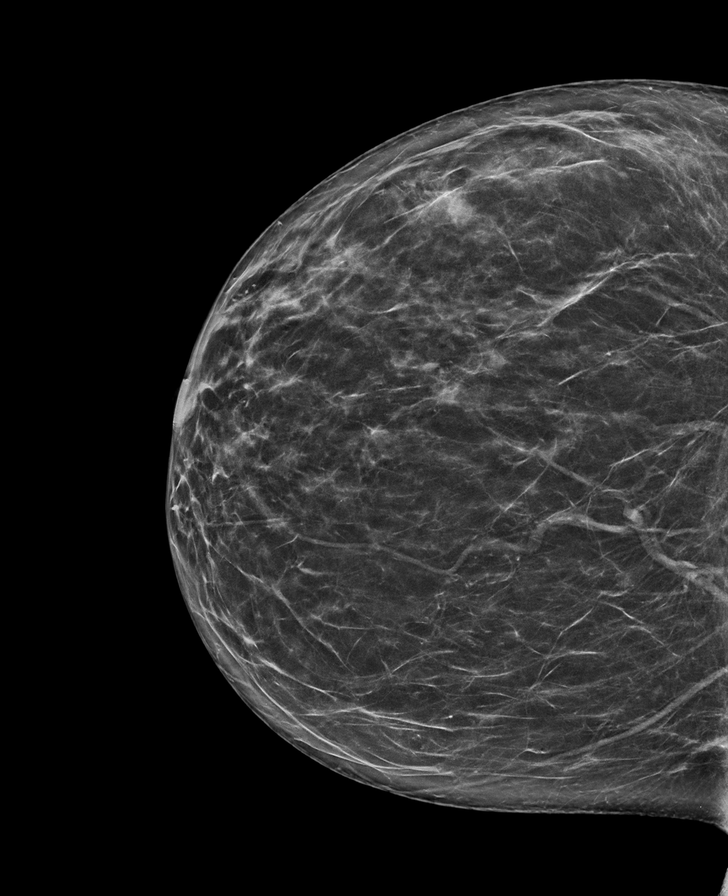

[L MLO synth-2D]
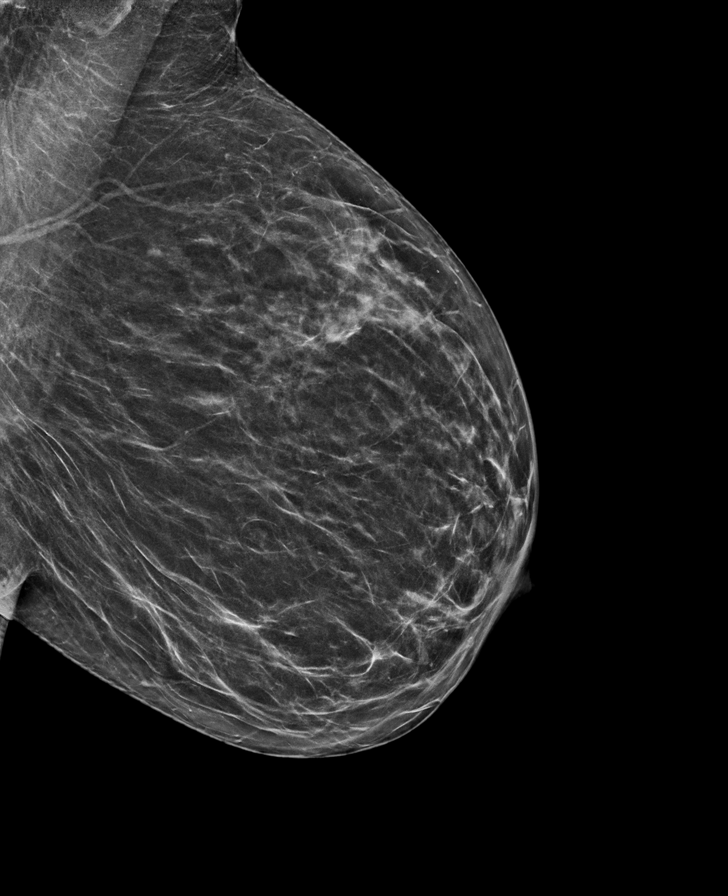

[R MLO synth-2D]
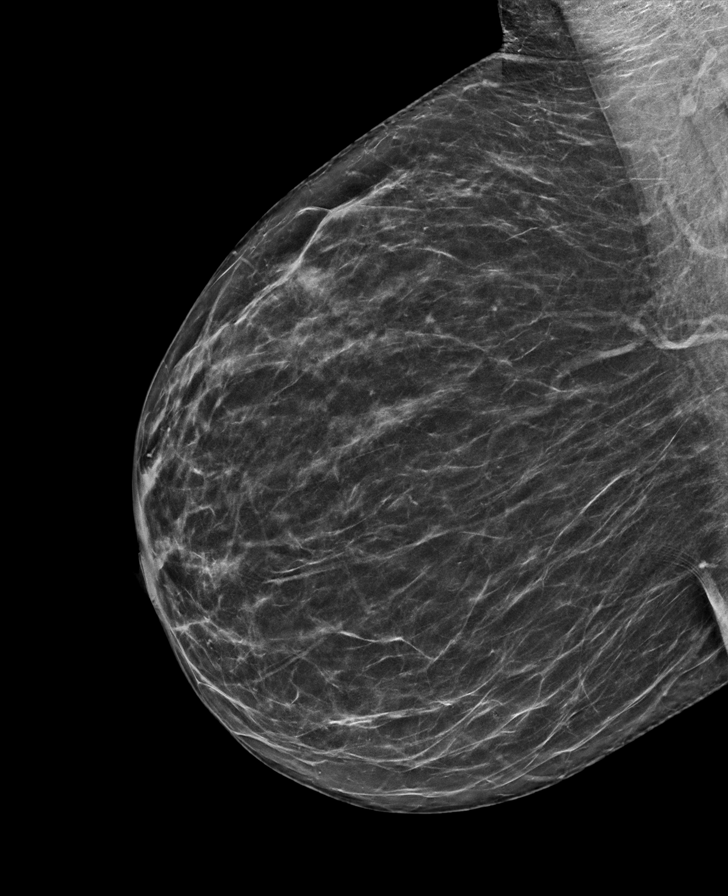

[L CC synth-2D]
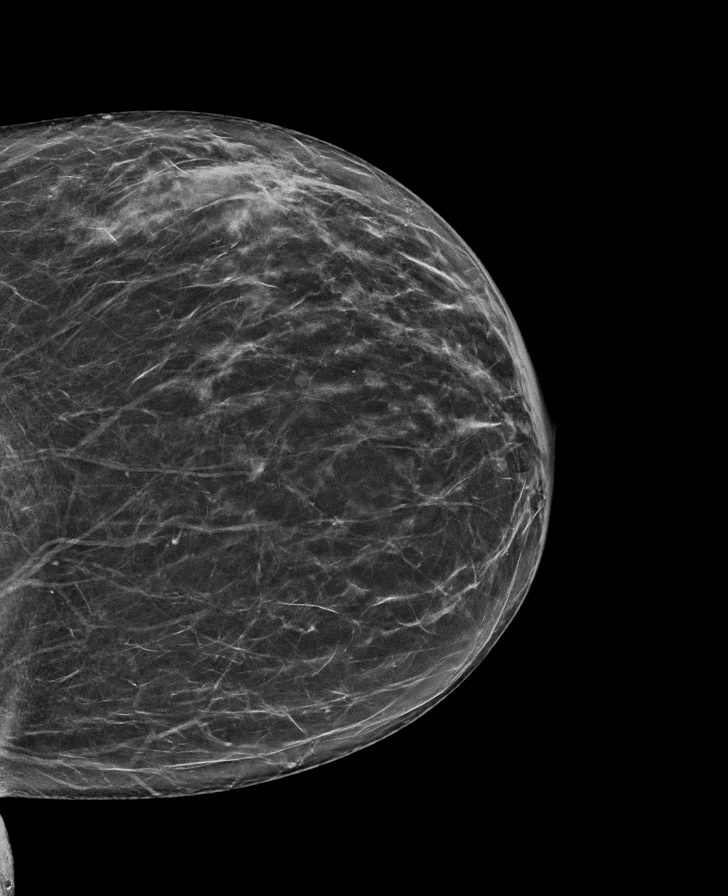

[R CC tomo · tomo slice 36/71.0]
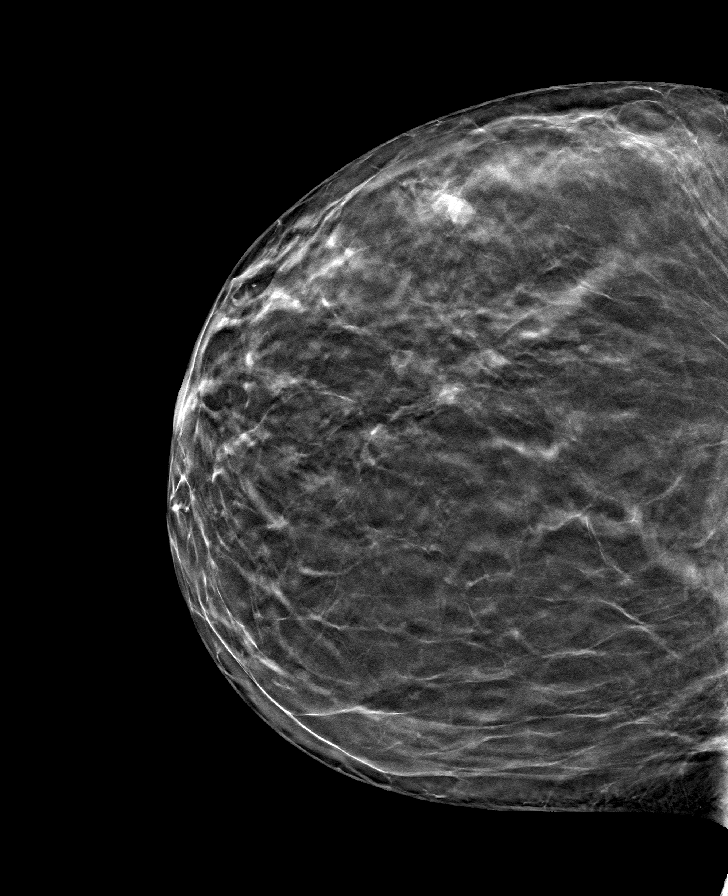

[L MLO tomo · tomo slice 33/66.0]
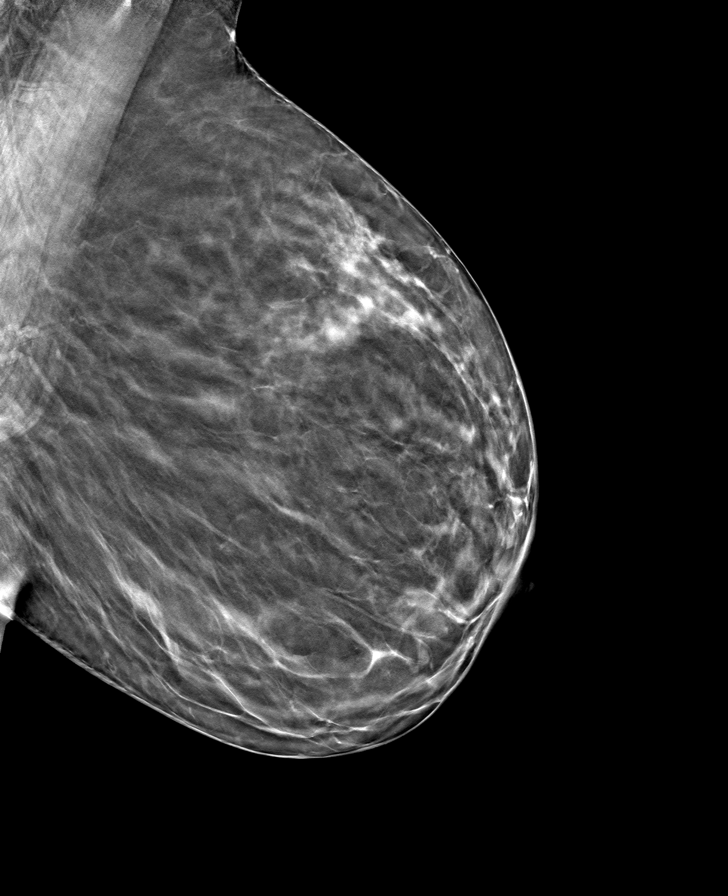

[R MLO tomo · tomo slice 36/71.0]
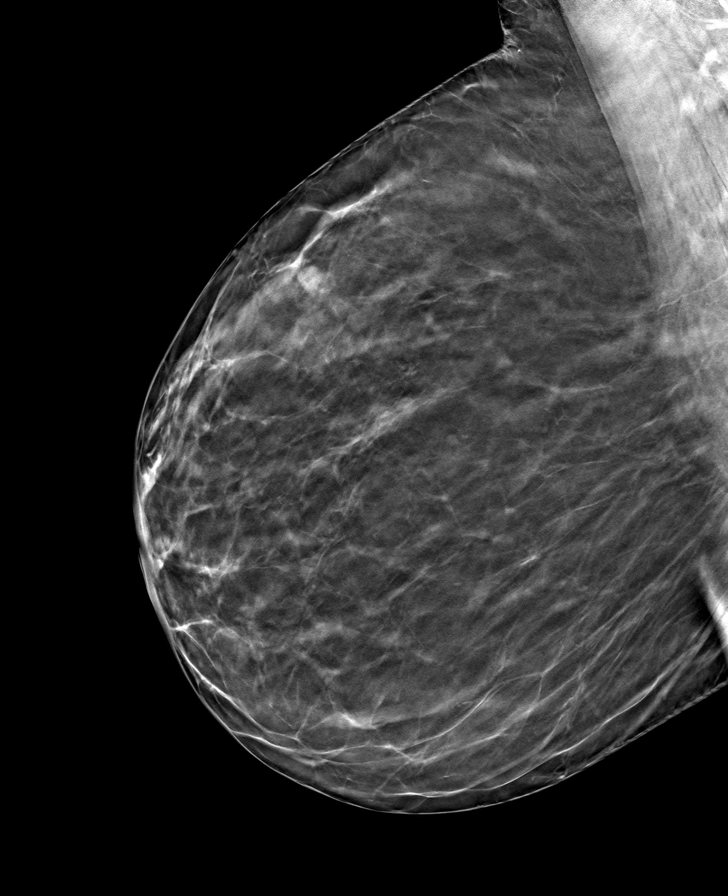

[L CC tomo · tomo slice 37/74.0]
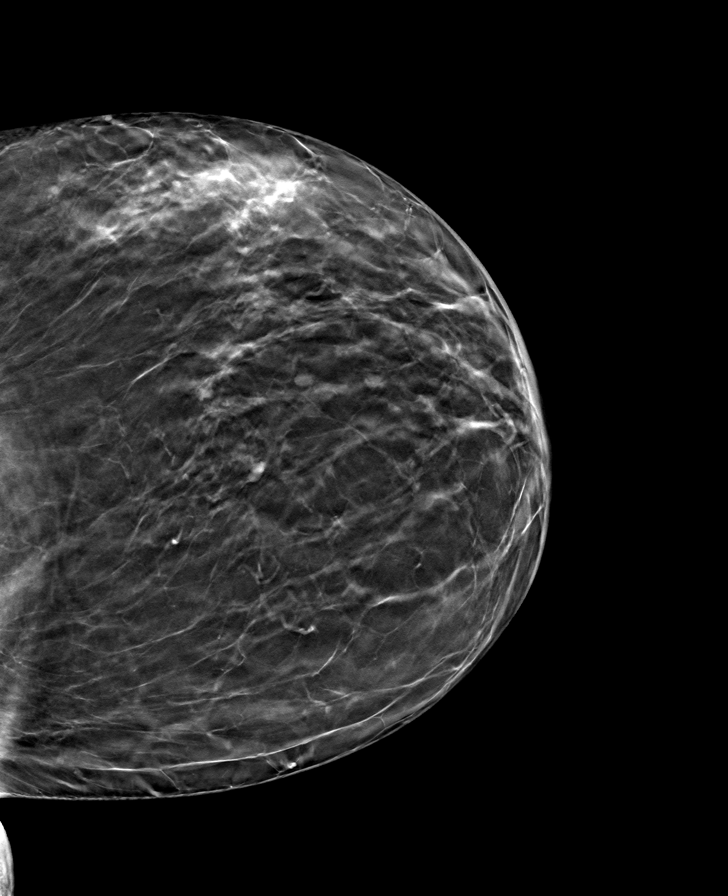

[8 of 24 positions shown; findings below may reference images not displayed]

ACR Breast Density Category b: There are scattered areas of
fibroglandular density.
FINDINGS: There are no findings suspicious for malignancy.
IMPRESSION: No mammographic evidence of malignancy. A result letter of this
screening mammogram will be mailed directly to the patient.

RECOMMENDATION:
Screening mammogram in one year. (Code:51-O-LD2)

BI-RADS CATEGORY  1: Negative.

## 2023-09-01 ENCOUNTER — Other Ambulatory Visit: Payer: Self-pay | Admitting: Obstetrics and Gynecology

## 2023-09-01 DIAGNOSIS — Z1231 Encounter for screening mammogram for malignant neoplasm of breast: Secondary | ICD-10-CM

## 2023-09-04 ENCOUNTER — Ambulatory Visit
Admission: RE | Admit: 2023-09-04 | Discharge: 2023-09-04 | Disposition: A | Discharge status: Home / Self Care | Source: Ambulatory Visit | Attending: Obstetrics and Gynecology | Admitting: Obstetrics and Gynecology

## 2023-09-04 DIAGNOSIS — Z1231 Encounter for screening mammogram for malignant neoplasm of breast: Secondary | ICD-10-CM | POA: Diagnosis present

## 2024-01-22 ENCOUNTER — Ambulatory Visit: Payer: Self-pay

## 2024-01-22 DIAGNOSIS — Z1211 Encounter for screening for malignant neoplasm of colon: Secondary | ICD-10-CM | POA: Diagnosis present

## 2024-01-22 DIAGNOSIS — Z83719 Family history of colon polyps, unspecified: Secondary | ICD-10-CM | POA: Diagnosis not present

## 2024-01-22 DIAGNOSIS — K640 First degree hemorrhoids: Secondary | ICD-10-CM | POA: Diagnosis not present

## 2024-01-22 DIAGNOSIS — K5730 Diverticulosis of large intestine without perforation or abscess without bleeding: Secondary | ICD-10-CM | POA: Diagnosis not present
